# Patient Record
Sex: Male | Born: 1935 | Race: White | Hispanic: No | Marital: Single | Smoking: Light tobacco smoker
Health system: Southern US, Community
[De-identification: ages and names within clinical notes are randomized; demographics above are authoritative.]

## PROBLEM LIST (undated history)

## (undated) DIAGNOSIS — C772 Secondary and unspecified malignant neoplasm of intra-abdominal lymph nodes: Principal | ICD-10-CM

## (undated) DIAGNOSIS — G629 Polyneuropathy, unspecified: Secondary | ICD-10-CM

## (undated) DIAGNOSIS — C259 Malignant neoplasm of pancreas, unspecified: Secondary | ICD-10-CM

## (undated) DIAGNOSIS — K219 Gastro-esophageal reflux disease without esophagitis: Secondary | ICD-10-CM

## (undated) HISTORY — DX: Polyneuropathy, unspecified: G62.9

## (undated) HISTORY — DX: Gastro-esophageal reflux disease without esophagitis: K21.9

## (undated) HISTORY — PX: HERNIA REPAIR: SHX51

## (undated) HISTORY — DX: Secondary and unspecified malignant neoplasm of intra-abdominal lymph nodes: C77.2

## (undated) HISTORY — DX: Malignant neoplasm of pancreas, unspecified: C25.9

---

## 2003-06-30 ENCOUNTER — Encounter (INDEPENDENT_AMBULATORY_CARE_PROVIDER_SITE_OTHER): Payer: Self-pay | Admitting: *Deleted

## 2003-06-30 ENCOUNTER — Ambulatory Visit (HOSPITAL_COMMUNITY): Admission: RE | Admit: 2003-06-30 | Discharge: 2003-06-30 | Payer: Self-pay | Admitting: Gastroenterology

## 2007-04-14 ENCOUNTER — Encounter: Admission: RE | Admit: 2007-04-14 | Discharge: 2007-04-14 | Payer: Self-pay | Admitting: Family Medicine

## 2007-04-18 ENCOUNTER — Encounter: Admission: RE | Admit: 2007-04-18 | Discharge: 2007-04-18 | Payer: Self-pay | Admitting: Family Medicine

## 2007-04-25 ENCOUNTER — Encounter: Admission: RE | Admit: 2007-04-25 | Discharge: 2007-04-25 | Payer: Self-pay | Admitting: Family Medicine

## 2007-11-11 ENCOUNTER — Encounter: Admission: RE | Admit: 2007-11-11 | Discharge: 2007-11-11 | Payer: Self-pay | Admitting: Internal Medicine

## 2008-05-10 ENCOUNTER — Encounter: Admission: RE | Admit: 2008-05-10 | Discharge: 2008-05-10 | Payer: Self-pay | Admitting: Internal Medicine

## 2009-06-08 ENCOUNTER — Encounter: Admission: RE | Admit: 2009-06-08 | Discharge: 2009-06-08 | Payer: Self-pay | Admitting: Internal Medicine

## 2010-07-10 ENCOUNTER — Encounter: Admission: RE | Admit: 2010-07-10 | Discharge: 2010-07-10 | Payer: Self-pay | Admitting: Internal Medicine

## 2010-10-08 ENCOUNTER — Encounter: Payer: Self-pay | Admitting: Family Medicine

## 2011-02-02 NOTE — Op Note (Signed)
   NAME:  Frederick Fitzgerald, Frederick Fitzgerald NO.:  [id number]   MEDICAL RECORD NO.:  [id number]                   PATIENT TYPE:  AMB   LOCATION:  ENDO                                 FACILITY:  [facility]   PHYSICIAN:  Graylin Shiver, M.D.                DATE OF BIRTH:  03/12/1935   DATE OF PROCEDURE:  06/30/2003  DATE OF DISCHARGE:                                 OPERATIVE REPORT   PROCEDURE:  Colonoscopy.   INDICATIONS FOR PROCEDURE:  Heme positive stool.   CONSENT:  Informed consent was obtained after explanation of the risks of  bleeding, infection, and perforation.   PREMEDICATION:  This procedure was done immediately after an EGD with  additional 25 mcg of Fentanyl and 2 mg of  given.   DESCRIPTION OF PROCEDURE:  With the patient in the left lateral decubitus  position, the rectal exam was performed and no masses were felt.  The  Olympus colonoscope was inserted into the rectum and advanced around the  colon to the cecum.  Cecal landmarks were identified.  The cecum and  ascending colon were normal.  The transverse colon was normal.  The  descending colon and sigmoid showed a occasional diverticulum present.  The  rectum was normal.  He tolerated the procedure well without complications.   IMPRESSION:  Mild diverticulosis of the left colon.  Otherwise normal  colonoscopy to the cecum.                                                 Graylin Shiver, M.D.    [name]  D:  06/30/2003  T:  06/30/2003  Job:  [id number]

## 2011-02-02 NOTE — Op Note (Signed)
   NAME:  Frederick Fitzgerald, Frederick Fitzgerald NO.:  [id number]   MEDICAL RECORD NO.:  [id number]                   PATIENT TYPE:  AMB   LOCATION:  ENDO                                 FACILITY:  [facility]   PHYSICIAN:  Graylin Shiver, M.D.                DATE OF BIRTH:  1935/06/18   DATE OF PROCEDURE:  06/30/2003  DATE OF DISCHARGE:                                 OPERATIVE REPORT   PROCEDURE:  Esophagogastroduodenoscopy with biopsies.   ENDOSCOPIST:  Graylin Shiver, M.D.   INDICATIONS:  Heartburn, heme-positive stool.   INFORMED CONSENT:  Informed consent was obtained after explanation of the  risks of bleeding, infection and perforation.   PREMEDICATIONS:  Fentanyl 75 mcg IV, Versed 8 mg IV.   DESCRIPTION OF PROCEDURE:  With the patient in the left lateral decubitus  position, the Olympus gastroscope was inserted into the oropharynx and  passed into the esophagus.  It was advanced down the esophagus and into the  stomach and into the duodenum.  The second portion and bulb of the duodenum  looked normal.  The stomach showed a diffuse erythematous-appearance to the  mucosa compatible with mild gastritis.  No ulcers or erosions were seen.  The distal stomach was biopsied for CLOtest to look for evidence of  Helicobacter pylori.  No lesions were seen in the fundus or cardia of the  stomach upon retroflexion.  There was a small hiatal hernia.  The scope was  then straightened and brought into the esophagus.  The esophagogastric  junction appeared to be at about 37-38 cm from the incisor teeth region.  There was some irritation of the mucosa in this region, and biopsies were  obtained.  I suspect this represents an esophagitis in this area.  The rest  of the esophagus looked normal.  He tolerated the procedure well without  complications.   IMPRESSION:  1. Mild gastritis.  Biopsies were obtained for CLOtest to look for evidence     of Helicobacter pylori.  2. Small hiatal  hernia.  3. Suspect esophagitis at the gastroesophageal junction region.    PLAN:  1. The biopsies will be checked.  2. I will give this patient a prescription for Protonix 40 mg daily to try     to help him with his heartburn.                                               Graylin Shiver, M.D.    [name]  D:  06/30/2003  T:  06/30/2003  Job:  [id number]   cc:   L. Lupe Carney, M.D.  [address] [place]  [place] [address]  Fax: [phone]

## 2011-08-23 ENCOUNTER — Other Ambulatory Visit: Payer: Self-pay | Admitting: Internal Medicine

## 2011-08-23 DIAGNOSIS — R52 Pain, unspecified: Secondary | ICD-10-CM

## 2011-10-01 ENCOUNTER — Ambulatory Visit
Admission: RE | Admit: 2011-10-01 | Discharge: 2011-10-01 | Disposition: A | Discharge status: Home / Self Care | Payer: PRIVATE HEALTH INSURANCE | Source: Ambulatory Visit | Attending: Internal Medicine | Admitting: Internal Medicine

## 2011-10-01 DIAGNOSIS — R52 Pain, unspecified: Secondary | ICD-10-CM

## 2011-10-01 MED ORDER — GADOBENATE DIMEGLUMINE 529 MG/ML IV SOLN
13.0000 mL | Freq: Once | INTRAVENOUS | Status: AC | PRN
  Administered 2011-10-01: 13 mL via INTRAVENOUS

## 2013-02-15 ENCOUNTER — Ambulatory Visit (INDEPENDENT_AMBULATORY_CARE_PROVIDER_SITE_OTHER): Payer: Medicare Other | Admitting: Family Medicine

## 2013-02-15 VITALS — BP 145/72 | HR 73 | Temp 97.6°F | Resp 16 | Ht 72.0 in | Wt 156.2 lb

## 2013-02-15 DIAGNOSIS — L299 Pruritus, unspecified: Secondary | ICD-10-CM

## 2013-02-15 DIAGNOSIS — L259 Unspecified contact dermatitis, unspecified cause: Secondary | ICD-10-CM

## 2013-02-15 DIAGNOSIS — L309 Dermatitis, unspecified: Secondary | ICD-10-CM

## 2013-02-15 MED ORDER — TRIAMCINOLONE ACETONIDE 0.1 % EX OINT: TOPICAL_OINTMENT | Freq: Two times a day (BID) | CUTANEOUS | Status: DC

## 2013-02-15 NOTE — Patient Instructions (Signed)
Use OTC loratadine (Claritin) 10 mg one time each day, and Aveeno lotion to the area 2-3 times daily. Return for re-evaluation in 48 hours, sooner if the symptoms worsen, or if you develop pain at the site, more swelling, or fever.

## 2013-02-15 NOTE — Progress Notes (Signed)
I have examined this patient along with the student and agree.  

## 2013-02-15 NOTE — Progress Notes (Signed)
  Subjective:    Patient ID: Frederick Fitzgerald, male    DOB: 1935/06/17, 78 y.o.   MRN: [id number]  HPI  Patient noticed area of redness on LEFT lateral hand proximal to MCP joint yesterday afternoon (02/14/2013). States he was working in attic yesterday morning and then noticed the redness in the late afternoon. Area is approximately 6mm in diameter, circumferential, erythematous, edematous, and described as "mildly itchy". Patient denies pain, pain with ROM, or tenderness to touch. Denies using any medications for itching or new products on skins. Patient suspects possible spider bite but did not see any spiders. Patient is right hand dominant. Denies fever/chills, headache, dizziness, SOB, chest pain, abdominal pain.   Review of Systems As above.    Objective:   Physical Exam  General: WDWN male, appears stated age, no apparent distress HEENT: normocephalic, atraumatic, sclera/conjunctiva clear, neck supple, no JVD Resp: no accessory muscle use, clear to auscultation bilaterally, no rales/rhonchi/wheezes Cardiac: RRR, no murmurs/rubs/gallops Extremities: moves all limbs spontaneously, full ROM in hands bilaterally, 5/5 strength hands bilaterally  Neuro: alert & oriented x3, cranial nerves II-XII grossly intact Skin: approximately 6mm diameter firm circumferential lesion located on lateral left hand proximal to MCP joint is erythematous and edematous with blanching surrounding the lesion, non tender to palpation  No results found for this or any previous visit.     Assessment & Plan:   Hand dermatitis - Plan: triamcinolone ointment (KENALOG) 0.1 %  Itching  OTC Claritin for itching  Aveeno lotion as needed for dry skin   Return if lesions increases in size, swells further, or becomes painful. Return if fever develops.

## 2013-02-17 NOTE — Progress Notes (Signed)
patient discussed and examined with Ms. Leotis Shames and student. Agree with assessment and plan of care per note below. Suspected spider bite - no central necrosis. rtc precautions discussed.

## 2013-04-22 ENCOUNTER — Other Ambulatory Visit: Payer: Self-pay

## 2013-04-23 ENCOUNTER — Other Ambulatory Visit: Payer: Self-pay | Admitting: Otolaryngology

## 2013-04-27 ENCOUNTER — Telehealth (INDEPENDENT_AMBULATORY_CARE_PROVIDER_SITE_OTHER): Payer: Self-pay

## 2013-04-27 NOTE — Telephone Encounter (Signed)
Erroneous encounter

## 2013-07-23 ENCOUNTER — Other Ambulatory Visit: Payer: Self-pay

## 2013-08-25 ENCOUNTER — Other Ambulatory Visit: Payer: Self-pay | Admitting: Gastroenterology

## 2014-12-15 ENCOUNTER — Ambulatory Visit (INDEPENDENT_AMBULATORY_CARE_PROVIDER_SITE_OTHER): Payer: Medicare HMO | Admitting: Family Medicine

## 2014-12-15 ENCOUNTER — Encounter: Payer: Self-pay | Admitting: Family Medicine

## 2014-12-15 VITALS — BP 120/72 | HR 68 | Temp 97.7°F | Resp 16 | Ht 71.5 in | Wt 152.8 lb

## 2014-12-15 DIAGNOSIS — Z1211 Encounter for screening for malignant neoplasm of colon: Secondary | ICD-10-CM

## 2014-12-15 DIAGNOSIS — D171 Benign lipomatous neoplasm of skin and subcutaneous tissue of trunk: Secondary | ICD-10-CM

## 2014-12-15 DIAGNOSIS — R010 Benign and innocent cardiac murmurs: Secondary | ICD-10-CM | POA: Diagnosis not present

## 2014-12-15 DIAGNOSIS — Z0000 Encounter for general adult medical examination without abnormal findings: Secondary | ICD-10-CM | POA: Diagnosis not present

## 2014-12-15 DIAGNOSIS — Z125 Encounter for screening for malignant neoplasm of prostate: Secondary | ICD-10-CM

## 2014-12-15 DIAGNOSIS — G629 Polyneuropathy, unspecified: Secondary | ICD-10-CM

## 2014-12-15 DIAGNOSIS — E7800 Pure hypercholesterolemia, unspecified: Secondary | ICD-10-CM

## 2014-12-15 DIAGNOSIS — R011 Cardiac murmur, unspecified: Secondary | ICD-10-CM

## 2014-12-15 LAB — POCT URINALYSIS DIPSTICK
BILIRUBIN UA: NEGATIVE
Blood, UA: NEGATIVE
Glucose, UA: NEGATIVE
KETONES UA: NEGATIVE
Leukocytes, UA: NEGATIVE
Nitrite, UA: NEGATIVE
PH UA: 6.5
Protein, UA: 0.2
Spec Grav, UA: 1.015
Urobilinogen, UA: 0.2

## 2014-12-15 LAB — BASIC METABOLIC PANEL
BUN: 12 mg/dL (ref 6–23)
CO2: 26 mEq/L (ref 19–32)
CREATININE: 0.75 mg/dL (ref 0.50–1.35)
Calcium: 9.3 mg/dL (ref 8.4–10.5)
Chloride: 102 mEq/L (ref 96–112)
Glucose, Bld: 84 mg/dL (ref 70–99)
Potassium: 4.1 mEq/L (ref 3.5–5.3)
Sodium: 139 mEq/L (ref 135–145)

## 2014-12-15 LAB — LIPID PANEL
CHOLESTEROL: 169 mg/dL (ref 0–200)
HDL: 58 mg/dL (ref >=40)
LDL CALC: 95 mg/dL (ref 0–99)
TRIGLYCERIDES: 82 mg/dL (ref <150)
Total CHOL/HDL Ratio: 2.9 Ratio
VLDL: 16 mg/dL (ref 0–40)

## 2014-12-15 LAB — VITAMIN B12: Vitamin B-12: 243 pg/mL (ref 211–911)

## 2014-12-15 NOTE — Progress Notes (Signed)
Subjective:    Patient ID: Frederick Fitzgerald, male    DOB: [birth year], 80 y.o.   MRN: [id number]  HPI  This 80 y.o. Male is here for [facility] Subsequent annual CPE. Last CPE w/ another PCP in 2014; pt has copy of labs results from Nov 2014. He does not receive vaccines. He had CRS in 2014 (normal). His has large lump on upper back; considering removal.   There are no active problems to display for this patient.    Prior to Admission medications   Medication Sig Start Date End Date Taking? Authorizing Provider  triamcinolone ointment (KENALOG) 0.1 % Apply topically 2 (two) times daily. Patient not taking: Reported on 12/15/2014 02/15/13   Fara Chute, PA-C    Past Surgical History  Procedure Laterality Date  . Hernia repair      History   Social History  . Marital Status: Single    Spouse Name: N/A  . Number of Children: N/A  . Years of Education: N/A   Occupational History  . Not on file.   Social History Main Topics  . Smoking status: Never Smoker   . Smokeless tobacco: Not on file  . Alcohol Use: No  . Drug Use: No  . Sexual Activity: Not on file   Other Topics Concern  . Not on file   Social History Narrative    Family History  Problem Relation Age of Onset  . Heart disease Mother     Review of Systems  Constitutional: Negative.   HENT: Negative.   Eyes: Negative.   Respiratory: Negative.   Cardiovascular: Negative.   Gastrointestinal: Negative.   Endocrine: Negative.   Genitourinary: Negative.   Musculoskeletal: Positive for back pain.  Skin: Negative.   Allergic/Immunologic: Negative.   Neurological: Positive for numbness.       Leg and feet  Hematological: Negative.   Psychiatric/Behavioral: Negative.        Objective:   Physical Exam  Constitutional: He is oriented to person, place, and time. Vital signs are normal. He appears well-developed and well-nourished. No distress.  Blood pressure 120/72, pulse 68, temperature 97.7 F (36.5 C),  temperature source Oral, resp. rate 16, height 5' 11.5" (1.816 m), weight 152 lb 12.8 oz (69.31 kg), SpO2 100 %.   HENT:  Head: Normocephalic and atraumatic.  Right Ear: Hearing, tympanic membrane, external ear and ear canal normal.  Left Ear: Hearing, tympanic membrane, external ear and ear canal normal.  Nose: Nose normal. No nasal deformity or septal deviation.  Mouth/Throat: Uvula is midline and mucous membranes are normal. No oral lesions. Dental caries present. No uvula swelling. Posterior oropharyngeal erythema present. No oropharyngeal exudate.  Eyes: Conjunctivae and EOM are normal. Pupils are equal, round, and reactive to light. No scleral icterus.  L lid ptosis.  Neck: Trachea normal, normal range of motion and phonation normal. Neck supple. No JVD present. No spinous process tenderness and no muscular tenderness present. Carotid bruit is not present. No thyroid mass and no thyromegaly present.  Cardiovascular: Normal rate, regular rhythm, S1 normal, S2 normal, intact distal pulses and normal pulses.   No extrasystoles are present. PMI is not displaced.  Exam reveals no gallop.   Murmur heard.  Systolic murmur is present with a grade of 3/6  SEM heard best at LLSB > apex.  Pulmonary/Chest: Effort normal and breath sounds normal. No respiratory distress. He has no decreased breath sounds. He has no wheezes. He has no rhonchi.  Abdominal: Soft. Normal  appearance and bowel sounds are normal. He exhibits no distension, no abdominal bruit, no pulsatile midline mass and no mass. There is no hepatosplenomegaly. There is no tenderness. There is no guarding and no CVA tenderness.  Scaphoid abdomen.  Genitourinary: Rectum normal and prostate normal. Rectal exam shows no external hemorrhoid, no fissure, no mass, no tenderness and anal tone normal. Guaiac negative stool. Prostate is not enlarged and not tender.  Musculoskeletal:       Cervical back: He exhibits decreased range of motion and  deformity. He exhibits no tenderness, no swelling and no pain.       Thoracic back: He exhibits deformity. He exhibits no tenderness.       Lumbar back: Normal.  Remainder of exam remarkable for mild degenerative changes. Thoracic spine- kyphosis.  Lymphadenopathy:    He has no cervical adenopathy.  Neurological: He is alert and oriented to person, place, and time. He has normal strength. He displays no atrophy. A cranial nerve deficit is present. No sensory deficit. He exhibits normal muscle tone. He displays a negative Romberg sign. Coordination and gait normal.  Reflex Scores:      Tricep reflexes are 2+ on the right side and 2+ on the left side.      Bicep reflexes are 2+ on the right side and 2+ on the left side.      Brachioradialis reflexes are 2+ on the right side and 2+ on the left side.      Patellar reflexes are 2+ on the right side and 2+ on the left side. L eyelid ptosis. GET UP and GO test: time= 8 sec.  Skin: Skin is warm, dry and intact. Lesion noted. No ecchymosis and no rash noted. He is not diaphoretic. No cyanosis or erythema. Nails show no clubbing.  Large lipoma on R upper back overlying scapula. Also has sebaceous cyst that has broken through the skin; hardened sebaceous material visible.  Psychiatric: He has a normal mood and affect. His speech is normal and behavior is normal. Thought content normal. Cognition and memory are normal.       Assessment & Plan:  Medicare annual wellness visit, subsequent - Plan: POCT urinalysis dipstick  Elevated LDL cholesterol level - Plan: Lipid panel  Screening for prostate cancer - Plan: PSA, Medicare  Peripheral neuropathy - Plan: Basic metabolic panel, Vitamin N56  Undiagnosed cardiac murmurs - Plan: 2D Echocardiogram without contrast  Lipoma of back -  Has another skin lesion in close proximity ot lipoma that could be removed at same time. Plan: Ambulatory referral to General Surgery  Screening for colon cancer - Plan:  IFOBT POC (occult bld, rslt in office)

## 2014-12-15 NOTE — Patient Instructions (Addendum)
Keeping you healthy  Get these tests  Blood pressure- Have your blood pressure checked once a year by your healthcare provider.  Normal blood pressure is 120/80  Weight- Have your body mass index (BMI) calculated to screen for obesity.  BMI is a measure of body fat based on height and weight. You can also calculate your own BMI at ViewBanking.si.  Cholesterol- Have your cholesterol checked every year.  Diabetes- Have your blood sugar checked regularly if you have high blood pressure, high cholesterol, have a family history of diabetes or if you are overweight.  Screening for Colon Cancer- Colonoscopy starting at age 94.  Screening may begin sooner depending on your family history and other health conditions. Follow up colonoscopy as directed by your Gastroenterologist.  Screening for Prostate Cancer- Both blood work (PSA) and a rectal exam help screen for Prostate Cancer.  Screening begins at age 54 with African-American men and at age 93 with Caucasian men.  Screening may begin sooner depending on your family history.  Take these medicines  Aspirin- One aspirin daily can help prevent Heart disease and Stroke.  Flu shot- Every fall.  Tetanus- Every 10 years. You have declined all vaccines.  Zostavax- Once after the age of 8 to prevent Shingles. You have declined all vaccines.  Pneumonia shot- Once after the age of 86; if you are younger than 59, ask your healthcare provider if you need a Pneumonia shot. You have declined all vaccines.  Take these steps  Don't smoke- If you do smoke, talk to your doctor about quitting.  For tips on how to quit, go to www.smokefree.gov or call 1-800-QUIT-NOW.  Be physically active- Exercise 5 days a week for at least 30 minutes.  If you are not already physically active start slow and gradually work up to 30 minutes of moderate physical activity.  Examples of moderate activity include walking briskly, mowing the yard, dancing, swimming,  bicycling, etc.  Eat a healthy diet- Eat a variety of healthy food such as fruits, vegetables, low fat milk, low fat cheese, yogurt, lean meant, poultry, fish, beans, tofu, etc. For more information go to www.thenutritionsource.org  Drink alcohol in moderation- Limit alcohol intake to less than two drinks a day. Never drink and drive.  Dentist- Brush and floss twice daily; visit your dentist twice a year.  Depression- Your emotional health is as important as your physical health. If you're feeling down, or losing interest in things you would normally enjoy please talk to your healthcare provider.  Eye exam- Visit your eye doctor every year.  Safe sex- If you may be exposed to a sexually transmitted infection, use a condom.  Seat belts- Seat belts can save your life; always wear one.  Smoke/Carbon Monoxide detectors- These detectors need to be installed on the appropriate level of your home.  Replace batteries at least once a year.  Skin cancer- When out in the sun, cover up and use sunscreen 15 SPF or higher.  Violence- If anyone is threatening you, please tell your healthcare provider.  Living Will/ Health care power of attorney- Speak with your healthcare provider and family.   Our clinic will contact you with appointments for the General Surgeon and Echocardiogram.  I will be in touch with you about the results of the ECHO once I have reviewed them. We can then discuss if further evaluation by a specialist is warranted. If you need to see a physician after June 1. 2016, I suggest you get established with Dr.  Carlota Raspberry or Dr. Tamala Julian.

## 2014-12-16 ENCOUNTER — Encounter: Payer: Medicare Other | Admitting: Family Medicine

## 2014-12-16 LAB — PSA, MEDICARE: PSA: 2.82 ng/mL (ref <=4.00)

## 2014-12-27 LAB — FECAL OCCULT BLOOD, GUAIAC: Fecal Occult Blood: NEGATIVE

## 2014-12-27 LAB — IFOBT (OCCULT BLOOD): IMMUNOLOGICAL FECAL OCCULT BLOOD TEST: NEGATIVE

## 2014-12-31 ENCOUNTER — Telehealth: Payer: Self-pay

## 2014-12-31 NOTE — Telephone Encounter (Signed)
[facility] called to schedule appt with patient. He states he knew nothing about a referral. Please advise

## 2015-01-01 NOTE — Telephone Encounter (Signed)
Spoke with pt. He was more worried about the Echo being covered under Medicare. Can we look into this for him. Thanks

## 2015-01-01 NOTE — Telephone Encounter (Signed)
Please contact [facility] to make sure that his echo can be covered by insurance.

## 2015-01-17 ENCOUNTER — Ambulatory Visit: Payer: Self-pay | Admitting: Surgery

## 2015-01-17 NOTE — H&P (Signed)
History of Present Illness Frederick Fitzgerald. Tessie Ordaz MD; 01/17/2015 10:51 AM) Patient words: lipoma right upper back.  The patient is a 80 year old male who presents with a complaint of Mass. Referred by Dr. Ellsworth Lennox - for evaluation of large mass on posterior right shoulder and chronic sebaceous cyst -posterior right shoulder. This is a 80 year old male in good health who presents with at least 20 years of an enlarging mass on his posterior right shoulder. This has become fairly large and causes some mild discomfort. It has never become infected or inflamed. Just medial and superior to this mass the patient has a small cyst that was previously drained by a primary care physician. It has recurred and has had some chronic drainage. This causes some mild discomfort. Other Problems (Ammie Eversole, LPN; 5/0/9326 71:24 AM) Back Pain Gastroesophageal Reflux Disease Heart murmur Inguinal Hernia Kidney Stone Other disease, cancer, significant illness  Past Surgical History (Ammie Eversole, LPN; 01/22/997 33:82 AM) Open Inguinal Hernia Surgery Bilateral. Oral Surgery  Diagnostic Studies History (Ammie Eversole, LPN; 5/0/5397 67:34 AM) Colonoscopy 1-5 years ago  Allergies (Ammie Eversole, LPN; 09/25/3788 24:09 AM) No Known Drug Allergies 01/17/2015  Medication History (Ammie Eversole, LPN; 03/19/5328 92:42 AM) Vitamin B 12 (100MCG Lozenge, Oral) Active.  Social History (Ammie Eversole, LPN; 02/22/3418 62:22 AM) Alcohol use Moderate alcohol use. Caffeine use Coffee, Tea. Tobacco use Former smoker.  Family History (Ammie Eversole, LPN; 05/25/9891 11:94 AM) Heart Disease Brother, Mother.     Review of Systems (Ammie Eversole LPN; 09/24/4079 44:81 AM) General Not Present- Appetite Loss, Chills, Fatigue, Fever, Night Sweats, Weight Gain and Weight Loss. Skin Present- Rash. Not Present- Change in Wart/Mole, Dryness, Hives, Jaundice, New Lesions, Non-Healing Wounds and Ulcer. HEENT  Present- Ringing in the Ears and Wears glasses/contact lenses. Not Present- Earache, Hearing Loss, Hoarseness, Nose Bleed, Oral Ulcers, Seasonal Allergies, Sinus Pain, Sore Throat, Visual Disturbances and Yellow Eyes. Respiratory Not Present- Bloody sputum, Chronic Cough, Difficulty Breathing, Snoring and Wheezing. Breast Not Present- Breast Mass, Breast Pain, Nipple Discharge and Skin Changes. Cardiovascular Not Present- Chest Pain, Difficulty Breathing Lying Down, Leg Cramps, Palpitations, Rapid Heart Rate, Shortness of Breath and Swelling of Extremities. Gastrointestinal Present- Bloating and Excessive gas. Not Present- Abdominal Pain, Bloody Stool, Change in Bowel Habits, Chronic diarrhea, Constipation, Difficulty Swallowing, Gets full quickly at meals, Hemorrhoids, Indigestion, Nausea, Rectal Pain and Vomiting. Male Genitourinary Not Present- Blood in Urine, Change in Urinary Stream, Frequency, Impotence, Nocturia, Painful Urination, Urgency and Urine Leakage. Musculoskeletal Present- Back Pain. Not Present- Joint Pain, Joint Stiffness, Muscle Pain, Muscle Weakness and Swelling of Extremities. Neurological Present- Numbness and Tingling. Not Present- Decreased Memory, Fainting, Headaches, Seizures, Tremor, Trouble walking and Weakness. Psychiatric Not Present- Anxiety, Bipolar, Change in Sleep Pattern, Depression, Fearful and Frequent crying. Endocrine Not Present- Cold Intolerance, Excessive Hunger, Hair Changes, Heat Intolerance, Hot flashes and New Diabetes. Hematology Not Present- Easy Bruising, Excessive bleeding, Gland problems, HIV and Persistent Infections.  Vitals (Ammie Eversole LPN; 04/21/6313 97:02 AM) 01/17/2015 10:12 AM Weight: 153.8 lb Height: 72in Body Surface Area: 1.88 m Body Mass Index: 20.86 kg/m Pulse: 80 (Regular)  BP: 128/62 (Sitting, Left Arm, Standard)     Physical Exam Rodman Key K. Halley Kincer MD; 01/17/2015 10:53 AM)  The physical exam findings are as  follows: Note:WDWN in NAD HEENT: EOMI, sclera anicteric Neck: No masses, no thyromegaly Lungs: CTA bilaterally; normal respiratory effort CV: Regular rate and rhythm; no murmurs Back: large well-demarcated subcutaneous soft tissue mass overlying right scapula; no sign  of infection or inflammation Medial and superior to the lipoma, there is a 2-3 cm firm subcutaneous mass with a 1 cm opening, draining some hard dried sebum; no active infection Abd: +bowel sounds, soft, non-tender, no masses Ext: Well-perfused; no edema Skin: Warm, dry; no sign of jaundice    Assessment & Plan Rodman Key K. 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 The surgical procedure has been discussed with the patient. Potential risks, benefits, alternative treatments, and expected outcomes have been explained. All of the patient's questions at this time have been answered. The likelihood of reaching the patient's treatment goal is good. The patient understand the proposed surgical procedure and wishes to proceed.   Frederick Fitzgerald. Georgette Dover, MD, [facility]  General/ Trauma Surgery  01/17/2015 10:53 AM

## 2015-01-25 ENCOUNTER — Other Ambulatory Visit (HOSPITAL_COMMUNITY): Payer: Self-pay | Admitting: Surgery

## 2015-03-14 ENCOUNTER — Other Ambulatory Visit: Payer: Self-pay

## 2015-12-06 ENCOUNTER — Encounter: Payer: Self-pay | Admitting: Family Medicine

## 2015-12-14 ENCOUNTER — Other Ambulatory Visit (INDEPENDENT_AMBULATORY_CARE_PROVIDER_SITE_OTHER): Payer: Medicare HMO

## 2015-12-14 ENCOUNTER — Telehealth: Payer: Self-pay | Admitting: Family Medicine

## 2015-12-14 DIAGNOSIS — Z131 Encounter for screening for diabetes mellitus: Secondary | ICD-10-CM

## 2015-12-14 DIAGNOSIS — Z125 Encounter for screening for malignant neoplasm of prostate: Secondary | ICD-10-CM

## 2015-12-14 DIAGNOSIS — Z13220 Encounter for screening for lipoid disorders: Secondary | ICD-10-CM

## 2015-12-14 DIAGNOSIS — E7800 Pure hypercholesterolemia, unspecified: Secondary | ICD-10-CM

## 2015-12-14 NOTE — Telephone Encounter (Signed)
Requests bloodwork for upcoming physical. On 12/14/15.  Labs ordered.

## 2015-12-15 DIAGNOSIS — Z125 Encounter for screening for malignant neoplasm of prostate: Secondary | ICD-10-CM | POA: Diagnosis not present

## 2015-12-15 DIAGNOSIS — E7800 Pure hypercholesterolemia, unspecified: Secondary | ICD-10-CM | POA: Diagnosis not present

## 2015-12-15 LAB — COMPREHENSIVE METABOLIC PANEL
ALK PHOS: 53 U/L (ref 40–115)
ALT: 21 U/L (ref 9–46)
AST: 25 U/L (ref 10–35)
Albumin: 4.1 g/dL (ref 3.6–5.1)
BILIRUBIN TOTAL: 0.7 mg/dL (ref 0.2–1.2)
BUN: 9 mg/dL (ref 7–25)
CALCIUM: 9.6 mg/dL (ref 8.6–10.3)
CO2: 28 mmol/L (ref 20–31)
Chloride: 104 mmol/L (ref 98–110)
Creat: 0.75 mg/dL (ref 0.70–1.18)
GLUCOSE: 91 mg/dL (ref 65–99)
Potassium: 5.1 mmol/L (ref 3.5–5.3)
Sodium: 139 mmol/L (ref 135–146)
TOTAL PROTEIN: 7 g/dL (ref 6.1–8.1)

## 2015-12-15 LAB — LIPID PANEL
CHOLESTEROL: 168 mg/dL (ref 125–200)
HDL: 51 mg/dL (ref >=40)
LDL Cholesterol: 100 mg/dL (ref <130)
Total CHOL/HDL Ratio: 3.3 Ratio (ref <=5.0)
Triglycerides: 83 mg/dL (ref <150)
VLDL: 17 mg/dL (ref <30)

## 2015-12-16 LAB — PSA, MEDICARE: PSA: 3.19 ng/mL (ref <=4.00)

## 2015-12-19 ENCOUNTER — Encounter: Payer: Medicare HMO | Admitting: Family Medicine

## 2015-12-20 ENCOUNTER — Encounter: Payer: Self-pay | Admitting: Family Medicine

## 2015-12-21 ENCOUNTER — Ambulatory Visit (INDEPENDENT_AMBULATORY_CARE_PROVIDER_SITE_OTHER): Payer: Medicare HMO | Admitting: Family Medicine

## 2015-12-21 ENCOUNTER — Ambulatory Visit (INDEPENDENT_AMBULATORY_CARE_PROVIDER_SITE_OTHER): Payer: Medicare HMO

## 2015-12-21 ENCOUNTER — Encounter: Payer: Self-pay | Admitting: Family Medicine

## 2015-12-21 VITALS — BP 124/68 | HR 92 | Temp 97.8°F | Resp 16 | Ht 70.5 in | Wt 147.4 lb

## 2015-12-21 DIAGNOSIS — R140 Abdominal distension (gaseous): Secondary | ICD-10-CM | POA: Diagnosis not present

## 2015-12-21 DIAGNOSIS — M47816 Spondylosis without myelopathy or radiculopathy, lumbar region: Secondary | ICD-10-CM | POA: Diagnosis not present

## 2015-12-21 DIAGNOSIS — M545 Low back pain: Secondary | ICD-10-CM | POA: Diagnosis not present

## 2015-12-21 DIAGNOSIS — H578 Other specified disorders of eye and adnexa: Secondary | ICD-10-CM | POA: Diagnosis not present

## 2015-12-21 DIAGNOSIS — R29890 Loss of height: Secondary | ICD-10-CM

## 2015-12-21 DIAGNOSIS — R1011 Right upper quadrant pain: Secondary | ICD-10-CM

## 2015-12-21 DIAGNOSIS — H5789 Other specified disorders of eye and adnexa: Secondary | ICD-10-CM

## 2015-12-21 DIAGNOSIS — M542 Cervicalgia: Secondary | ICD-10-CM

## 2015-12-21 DIAGNOSIS — L568 Other specified acute skin changes due to ultraviolet radiation: Secondary | ICD-10-CM

## 2015-12-21 DIAGNOSIS — M546 Pain in thoracic spine: Secondary | ICD-10-CM | POA: Diagnosis not present

## 2015-12-21 DIAGNOSIS — Z0000 Encounter for general adult medical examination without abnormal findings: Secondary | ICD-10-CM | POA: Diagnosis not present

## 2015-12-21 DIAGNOSIS — G6289 Other specified polyneuropathies: Secondary | ICD-10-CM

## 2015-12-21 NOTE — Progress Notes (Signed)
Subjective:    Patient ID: Frederick Fitzgerald, male    DOB: 1935/08/31, 81 y.o.   MRN: [id number] By signing my name below, I, Zola Button, attest that this documentation has been prepared under the direction and in the presence of [name], MD.  Electronically Signed: Zola Button, Medical Scribe. 12/21/2015. 1:48 PM.  HPI  HPI Comments: Frederick Fitzgerald is a 81 y.o. male who presents to the [facility] for an annual exam. Last annual Wellness Exam in Mar 2016 with Dr. Leward Quan. Heart murmur was noted at that time. Echocardiogram was ordered, but it does not appear this has been done. There was some concerns about the echo being covered by his insurance.   Cancer screening:  Colon cancer screening - Per last visit, he had a colorectal screening in 2014 which was normal. Prostate cancer screening - PSA drawn last week. 3.19. Slightly increased from 2.82 in Mar 2016. Lipid panel and CMP were normal. Patient denies difficulty urinating and hematuria.  Immunizations: He refused vaccines last year. He does not usually get vaccinations. Immunization History  Administered Date(s) Administered  . Td 09/17/2005    Depression screening:  Depression screen [facility] 2/9 12/21/2015 12/15/2014 12/15/2014  Decreased Interest 0 0 0  Down, Depressed, Hopeless 0 0 -  PHQ - 2 Score 0 0 0   Fall screening: No falls in the past year.  Functional status screening: Blind in left eye, otherwise no positive responses.  Optho: His last visit with an eye doctor was about 3 years ago. 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  Visual Acuity Screening   Right eye Left eye Both eyes  Without correction:     With correction: 20/40    Comments: Pt stated that &quot;He is legally blind in left eye.   Physical activity: He does not exercise regularly.  Dentist: He last saw his dentist,  about a year ago.  Advanced directives: He does not have a living will.  Abdominal bloating:  Patient has had some abdominal bloating/gas after eating. This resolves on its own without passing gas. He has tried peppermint tea for this. He has had intermittent RUQ abdominal discomfort as a result over the past 2 months. He has the discomfort every day. This is not associated with eating. Patient denies cough, SOB, fever, diarrhea, nausea, vomiting, hematuria, and heartburn.  Loss of height: Patient states he has had some noticeable loss of height over the past 4-5 years.  He feels he has been slouching more. Patient states he has had low back pain over the past 50 years or so. He had been evaluated for the back pain in the past; it had been suggested that he have surgery for his back, but he declined because he felt he could deal with the pain. He has a FMHx of osteoporosis in his mother and nephew.  Tinnitus: Patient has had chronic tinnitus. He denies hearing loss.  Eye itching: He has had itching in his left eye, which he thinks could be due to a dry socket. He has not tried any eyedrops. He has not seen an eye doctor recently.  Drooling: Patient has been drooling at night.  Neck pain: He has neck pain which he attributes to the way he sleeps. This is not a new problem.  Sun rashes: Patient states he will have erythematous, pruritic rashes shortly after sun exposure, but these resolve on their own within a few hours. He has had  these rashes over the past few years.  Numbness: He has had numbness and tingling in his feet and hands (mostly left wrist) for over 12 years, but this has been worsening every year. Patient had been told he had peripheral neuropathy due to his back. He had a normal vitamin B12 in Mar 2016.  Nevi: Patient states he has some nevi behind his left ear and on his left cheek which he would like examined and would like to be evaluated by dermatology. He has not seen a dermatologist recently.   There are no active problems to display for this patient.  Past Medical  History  Diagnosis Date  . GERD (gastroesophageal reflux disease)    Past Surgical History  Procedure Laterality Date  . Hernia repair     No Known Allergies Prior to Admission medications   Medication Sig Start Date End Date Taking? Authorizing Provider  triamcinolone ointment (KENALOG) 0.1 % Apply topically 2 (two) times daily. Patient not taking: Reported on 12/15/2014 02/15/13   Harrison Mons, PA-C   Social History   Social History  . Marital Status: Single    Spouse Name: N/A  . Number of Children: N/A  . Years of Education: N/A   Occupational History  . Not on file.   Social History Main Topics  . Smoking status: Never Smoker   . Smokeless tobacco: Not on file  . Alcohol Use: No  . Drug Use: No  . Sexual Activity: Not on file   Other Topics Concern  . Not on file   Social History Narrative     Review of Systems  HENT: Positive for drooling and tinnitus.   Eyes: Positive for itching.  Gastrointestinal: Positive for abdominal pain.  Musculoskeletal: Positive for back pain and neck pain.  Skin: Positive for rash.  Neurological: Positive for numbness.  All other systems reviewed and are negative.  13 point ROS reviewed on patient health survey. Negative other than listed above.     Objective:   Physical Exam  Constitutional: He is oriented to person, place, and time. He appears well-developed and well-nourished.  HENT:  Head: Normocephalic and atraumatic.  Right Ear: External ear normal.  Left Ear: External ear normal.  Mouth/Throat: Oropharynx is clear and moist.  Eyes: Conjunctivae and EOM are normal. Pupils are equal, round, and reactive to light.  Left eye ptosis. Able to approximate eyes without difficulty. Able to open partially on the left.  Neck: Normal range of motion. Neck supple. No thyromegaly present.  Cardiovascular: Normal rate, regular rhythm and intact distal pulses.   Murmur heard.  Systolic murmur is present with a grade of 2/6    Pulses:      Radial pulses are 2+ on the right side, and 2+ on the left side.       Dorsalis pedis pulses are 2+ on the right side, and 2+ on the left side.  Varicosities into the lower extremities. Capillary refill < 1 second. Approximately 2/6 systolic murmur, left sternal border. No murmur at the carotids.  Pulmonary/Chest: Effort normal and breath sounds normal. No respiratory distress. He has no wheezes.  Pectus carinatum on chest wall.  Abdominal: Soft. He exhibits no distension. There is no tenderness. Hernia confirmed negative in the right inguinal area and confirmed negative in the left inguinal area.  Non-tender on exam. Area of concern is located RUQ and just below the right rib margin.  Genitourinary: Prostate normal.  Musculoskeletal: Normal range of motion. He exhibits no edema  or tenderness.  Slight kyphosis mid to upper back.  Lymphadenopathy:    He has no cervical adenopathy.  Neurological: He is alert and oriented to person, place, and time. He has normal reflexes.  Slight decreased sensation, dorsum of left foot. Decreased sensation bilateral plantar feet. Sensation overall equal on both wrists.  Skin: Skin is warm and dry.  Multiple scattered nevi, left greater than right back. Large, thickened projection, left upper back.  Psychiatric: He has a normal mood and affect. His behavior is normal.  Vitals reviewed.   Filed Vitals:   12/21/15 1329  BP: 124/68  Pulse: 92  Temp: 97.8 F (36.6 C)  TempSrc: Oral  Resp: 16  Height: 5' 10.5" (1.791 m)  Weight: 147 lb 6.4 oz (66.86 kg)  SpO2: 99%    Dg Thoracic Spine 2 View  12/21/2015  CLINICAL DATA:  Back pain.  Loss of height. EXAM: THORACIC SPINE 2 VIEWS COMPARISON:  None FINDINGS: There is osteopenia with slight accentuation of the thoracic kyphosis. There is slight disc space narrowing in the mid thoracic spine. No fractures or bone destruction. Minimal mid thoracic scoliosis. IMPRESSION: No fractures or destructive  lesions. Slight scoliosis and accentuated thoracic kyphosis. Electronically Signed   By: Lorriane Shire M.D.   On: 12/21/2015 15:06   Dg Lumbar Spine 2-3 Views  12/21/2015  CLINICAL DATA:  Chronic back pain.  Height loss. EXAM: LUMBAR SPINE - 2-3 VIEW COMPARISON:  MRI 10/01/2011.  CT 07/10/2010. FINDINGS: Diffuse severe degenerative change with scoliosis concave right. No acute abnormality. No compression fracture noted. Soft tissues unremarkable. IMPRESSION: Prominent scoliosis concave right with diffuse severe degenerative change. No acute bony abnormality identified. Electronically Signed   By: Marcello Moores  Register   On: 12/21/2015 15:05        Assessment & Plan:  Frederick Fitzgerald is a 81 y.o. male Medicare annual wellness visit, subsequent  --anticipatory guidance as below in AVS, screening labs above. Health maintenance items as above in HPI discussed/recommended as applicable. Advised to let me know if he changes his mind on immunizations.  Midline low back pain, with sciatica presence unspecified - Plan: DG Lumbar Spine 2-3 Views, DG Thoracic Spine 2 View Loss of height - Plan: DG Lumbar Spine 2-3 Views, DG Thoracic Spine 2 View  Apparent compression fractures or acute findings on imaging. Suspect degenerative disc disease/arthritic change. Can discuss further at follow-up visit. History of osteoporosis, so may benefit from screening. This can be discussed further next visit including more details on his family history.  Bloating symptom RUQ abdominal pain - Plan: US Abdomen Limited RUQ  -Check right upper quadrant ultrasound. Recent liver testing normal. Advised to avoid foods that are associated with gas or bloating. RTC precautions if acute worsening.  Other polyneuropathy ([place]) - Plan: TSH, Ambulatory referral to Neurology  -Peripheral neuropathy. Refer to neurology for evaluation.  Neck pain  -Degenerative disc disease likely. Symptomatic care discussed, RTC precautions if  worsening.  Itch of left eye  -Allergies versus dry eye. For now can try blink or refresh artificial tears, if symptoms persist, return to discuss further or can follow-up with his ophthalmologist to evaluate this problem  Photodermatitis due to sun - Plan: Ambulatory referral to Dermatology  -Refer to dermatology for evaluation. Keep covered or avoid sun exposure for now.  No orders of the defined types were placed in this encounter.   Patient Instructions    IF you received an x-ray today, you will receive an invoice from [facility]  Radiology. Please contact [facility]iology at [phone] with questions or concerns regarding your invoice.   IF you received labwork today, you will receive an invoice from [employer]. Please contact [facility] at [phone] with questions or concerns regarding your invoice.   Our billing staff will not be able to assist you with questions regarding bills from these companies.  You will be contacted with the lab results as soon as they are available. The fastest way to get your results is to activate your My Chart account. Instructions are located on the last page of this paperwork. If you have not heard from [place] regarding the results in 2 weeks, please contact this office.    For dry eye - can try Systane, Blink, or artificial tears over the counter. If these do not help - return to discuss further, or follow up with eye care provider.   I will refer you to dermatology for skin check and to discuss rash with sun exposure.   PSA has increase some this past year - return for repeat test in 6 months, sooner if any new urinary symptoms.  Call your eye care provider for appointment.   i will check a thyroid test and refer you to neurologist for neuropathy in feet.   Your liver tests looked ok on labs last week, but with your upper abdominal pain - will check ultrasound.  Follow up in next few weeks to discuss this further if  persists and to finish discussing other items from today, including bloating, and ringing in ears.   Return to the clinic or go to the nearest emergency room if any of your symptoms worsen or new symptoms occur.  See handout on advanced directives and let me know if we can help you with this paperwork.   Keeping you healthy  Get these tests  Blood pressure- Have your blood pressure checked once a year by your healthcare provider.  Normal blood pressure is 120/80  Weight- Have your body mass index (BMI) calculated to screen for obesity.  BMI is a measure of body fat based on height and weight. You can also calculate your own BMI at ViewBanking.[email or web address].  Cholesterol- Have your cholesterol checked every year.  Diabetes- Have your blood sugar checked regularly if you have high blood pressure, high cholesterol, have a family history of diabetes or if you are overweight.  Screening for Colon Cancer- Colonoscopy starting at age 64.  Screening may begin sooner depending on your family history and other health conditions. Follow up colonoscopy as directed by your Gastroenterologist.  Screening for Prostate Cancer- Both blood work (PSA) and a rectal exam help screen for Prostate Cancer.  Screening begins at age 55 with African-American men and at age 68 with Caucasian men.  Screening may begin sooner depending on your family history.  Take these medicines  Aspirin- One aspirin daily can help prevent Heart disease and Stroke.  Flu shot- Every fall.  Tetanus- Every 10 years.  Zostavax- Once after the age of 38 to prevent Shingles.  Pneumonia shot- Once after the age of 67; if you are younger than 29, ask your healthcare provider if you need a Pneumonia shot.  Take these steps  Don't smoke- If you do smoke, talk to your doctor about quitting.  For tips on how to quit, go to www.smokefree.gov or call 1-800-QUIT-NOW.  Be physically active- Exercise 5 days a week for at least 30 minutes.   If you are not already physically active start  slow and gradually work up to 30 minutes of moderate physical activity.  Examples of moderate activity include walking briskly, mowing the yard, dancing, swimming, bicycling, etc.  Eat a healthy diet- Eat a variety of healthy food such as fruits, vegetables, low fat milk, low fat cheese, yogurt, lean meant, poultry, fish, beans, tofu, etc. For more information go to www.thenutritionsource.org  Drink alcohol in moderation- Limit alcohol intake to less than two drinks a day. Never drink and drive.  Dentist- Brush and floss twice daily; visit your dentist twice a year.  Depression- Your emotional health is as important as your physical health. If you're feeling down, or losing interest in things you would normally enjoy please talk to your healthcare provider.  Eye exam- Visit your eye doctor every year.  Safe sex- If you may be exposed to a sexually transmitted infection, use a condom.  Seat belts- Seat belts can save your life; always wear one.  Smoke/Carbon Monoxide detectors- These detectors need to be installed on the appropriate level of your home.  Replace batteries at least once a year.  Skin cancer- When out in the sun, cover up and use sunscreen 15 SPF or higher.  Violence- If anyone is threatening you, please tell your healthcare provider.  Living Will/ Health care power of attorney- Speak with your healthcare provider and family.  Abdominal Pain, Adult Many things can cause abdominal pain. Usually, abdominal pain is not caused by a disease and will improve without treatment. It can often be observed and treated at home. Your health care provider will do a physical exam and possibly order blood tests and X-rays to help determine the seriousness of your pain. However, in many cases, more time must pass before a clear cause of the pain can be found. Before that point, your health care provider may not know if you need more testing or  further treatment. HOME CARE INSTRUCTIONS Monitor your abdominal pain for any changes. The following actions may help to alleviate any discomfort you are experiencing:  Only take over-the-counter or prescription medicines as directed by your health care provider.  Do not take laxatives unless directed to do so by your health care provider.  Try a clear liquid diet (broth, tea, or water) as directed by your health care provider. Slowly move to a bland diet as tolerated. SEEK MEDICAL CARE IF:  You have unexplained abdominal pain.  You have abdominal pain associated with nausea or diarrhea.  You have pain when you urinate or have a bowel movement.  You experience abdominal pain that wakes you in the night.  You have abdominal pain that is worsened or improved by eating food.  You have abdominal pain that is worsened with eating fatty foods.  You have a fever. SEEK IMMEDIATE MEDICAL CARE IF:  Your pain does not go away within 2 hours.  You keep throwing up (vomiting).  Your pain is felt only in portions of the abdomen, such as the right side or the left lower portion of the abdomen.  You pass bloody or black tarry stools. MAKE SURE YOU:  Understand these instructions.  Will watch your condition.  Will get help right away if you are not doing well or get worse.   This information is not intended to replace advice given to you by your health care provider. Make sure you discuss any questions you have with your health care provider.   Document Released: 06/13/2005 Document Revised: 05/25/2015 Document Reviewed: 05/13/2013 Elsevier Interactive Patient Education  2016 Elsevier Inc.   Peripheral Neuropathy Peripheral neuropathy is a type of nerve damage. It affects nerves that carry signals between the spinal cord and other parts of the body. These are called peripheral nerves. With peripheral neuropathy, one nerve or a group of nerves may be damaged.  CAUSES  Many things can  damage peripheral nerves. For some people with peripheral neuropathy, the cause is unknown. Some causes include:  Diabetes. This is the most common cause of peripheral neuropathy.  Injury to a nerve.  Pressure or stress on a nerve that lasts a long time.  Too little vitamin B. Alcoholism can lead to this.  Infections.  Autoimmune diseases, such as multiple sclerosis and systemic lupus erythematosus.  Inherited nerve diseases.  Some medicines, such as cancer drugs.  Toxic substances, such as lead and mercury.  Too little blood flowing to the legs.  Kidney disease.  Thyroid disease. SIGNS AND SYMPTOMS  Different people have different symptoms. The symptoms you have will depend on which of your nerves is damaged. Common symptoms include:  Loss of feeling (numbness) in the feet and hands.  Tingling in the feet and hands.  Pain that burns.  Very sensitive skin.  Weakness.  Not being able to move a part of the body (paralysis).  Muscle twitching.  Clumsiness or poor coordination.  Loss of balance.  Not being able to control your bladder.  Feeling dizzy.  Sexual problems. DIAGNOSIS  Peripheral neuropathy is a symptom, not a disease. Finding the cause of peripheral neuropathy can be hard. To figure that out, your health care provider will take a medical history and do a physical exam. A neurological exam will also be done. This involves checking things affected by your brain, spinal cord, and nerves (nervous system). For example, your health care provider will check your reflexes, how you move, and what you can feel.  Other types of tests may also be ordered, such as:  Blood tests.  A test of the fluid in your spinal cord.  Imaging tests, such as CT scans or an MRI.  Electromyography (EMG). This test checks the nerves that control muscles.  Nerve conduction velocity tests. These tests check how fast messages pass through your nerves.  Nerve biopsy. A small  piece of nerve is removed. It is then checked under a microscope. TREATMENT   Medicine is often used to treat peripheral neuropathy. Medicines may include:  Pain-relieving medicines. Prescription or over-the-counter medicine may be suggested.  Antiseizure medicine. This may be used for pain.  Antidepressants. These also may help ease pain from neuropathy.  Lidocaine. This is a numbing medicine. You might wear a patch or be given a shot.  Mexiletine. This medicine is typically used to help control irregular heart rhythms.  Surgery. Surgery may be needed to relieve pressure on a nerve or to destroy a nerve that is causing pain.  Physical therapy to help movement.  Assistive devices to help movement. HOME CARE INSTRUCTIONS   Only take over-the-counter or prescription medicines as directed by your health care provider. Follow the instructions carefully for any given medicines. Do not take any other medicines without first getting approval from your health care provider.  If you have diabetes, work closely with your health care provider to keep your blood sugar under control.  If you have numbness in your feet:  Check every day for signs of injury or infection. Watch for redness, warmth, and swelling.  Wear padded socks and comfortable shoes. These help protect  your feet.  Do not do things that put pressure on your damaged nerve.  Do not smoke. Smoking keeps blood from getting to damaged nerves.  Avoid or limit alcohol. Too much alcohol can cause a lack of B vitamins. These vitamins are needed for healthy nerves.  Develop a good support system. Coping with peripheral neuropathy can be stressful. Talk to a mental health specialist or join a support group if you are struggling.  Follow up with your health care provider as directed. SEEK MEDICAL CARE IF:   You have new signs or symptoms of peripheral neuropathy.  You are struggling emotionally from dealing with peripheral  neuropathy.  You have a fever. SEEK IMMEDIATE MEDICAL CARE IF:   You have an injury or infection that is not healing.  You feel very dizzy or begin vomiting.  You have chest pain.  You have trouble breathing.   This information is not intended to replace advice given to you by your health care provider. Make sure you discuss any questions you have with your health care provider.   Document Released: 08/24/2002 Document Revised: 05/16/2011 Document Reviewed: 05/11/2013 Elsevier Interactive Patient Education [employer].     I personally performed the services described in this documentation, which was scribed in my presence. The recorded information has been reviewed and considered, and addended by me as needed.

## 2015-12-21 NOTE — Patient Instructions (Addendum)
IF you received an x-ray today, you will receive an invoice from [facility]. Please contact [facility] at [phone] with questions or concerns regarding your invoice.   IF you received labwork today, you will receive an invoice from [employer]. Please contact [facility] at [phone] with questions or concerns regarding your invoice.   Our billing staff will not be able to assist you with questions regarding bills from these companies.  You will be contacted with the lab results as soon as they are available. The fastest way to get your results is to activate your My Chart account. Instructions are located on the last page of this paperwork. If you have not heard from [place] regarding the results in 2 weeks, please contact this office.    For dry eye - can try Systane, Blink, or artificial tears over the counter. If these do not help - return to discuss further, or follow up with eye care provider.   I will refer you to dermatology for skin check and to discuss rash with sun exposure.   PSA has increase some this past year - return for repeat test in 6 months, sooner if any new urinary symptoms.  Call your eye care provider for appointment.   i will check a thyroid test and refer you to neurologist for neuropathy in feet.   Your liver tests looked ok on labs last week, but with your upper abdominal pain - will check ultrasound.  Follow up in next few weeks to discuss this further if persists and to finish discussing other items from today, including bloating, and ringing in ears.   Return to the clinic or go to the nearest emergency room if any of your symptoms worsen or new symptoms occur.  See handout on advanced directives and let me know if we can help you with this paperwork.   Keeping you healthy  Get these tests  Blood pressure- Have your blood pressure checked once a year by your healthcare provider.  Normal blood pressure is  120/80  Weight- Have your body mass index (BMI) calculated to screen for obesity.  BMI is a measure of body fat based on height and weight. You can also calculate your own BMI at ViewBanking.[email or web address].  Cholesterol- Have your cholesterol checked every year.  Diabetes- Have your blood sugar checked regularly if you have high blood pressure, high cholesterol, have a family history of diabetes or if you are overweight.  Screening for Colon Cancer- Colonoscopy starting at age 37.  Screening may begin sooner depending on your family history and other health conditions. Follow up colonoscopy as directed by your Gastroenterologist.  Screening for Prostate Cancer- Both blood work (PSA) and a rectal exam help screen for Prostate Cancer.  Screening begins at age 62 with African-American men and at age 71 with Caucasian men.  Screening may begin sooner depending on your family history.  Take these medicines  Aspirin- One aspirin daily can help prevent Heart disease and Stroke.  Flu shot- Every fall.  Tetanus- Every 10 years.  Zostavax- Once after the age of 14 to prevent Shingles.  Pneumonia shot- Once after the age of 50; if you are younger than 92, ask your healthcare provider if you need a Pneumonia shot.  Take these steps  Don't smoke- If you do smoke, talk to your doctor about quitting.  For tips on how to quit, go to www.smokefree.gov or call 1-800-QUIT-NOW.  Be physically active- Exercise 5 days a week for at  least 30 minutes.  If you are not already physically active start slow and gradually work up to 30 minutes of moderate physical activity.  Examples of moderate activity include walking briskly, mowing the yard, dancing, swimming, bicycling, etc.  Eat a healthy diet- Eat a variety of healthy food such as fruits, vegetables, low fat milk, low fat cheese, yogurt, lean meant, poultry, fish, beans, tofu, etc. For more information go to www.thenutritionsource.org  Drink alcohol in  moderation- Limit alcohol intake to less than two drinks a day. Never drink and drive.  Dentist- Brush and floss twice daily; visit your dentist twice a year.  Depression- Your emotional health is as important as your physical health. If you're feeling down, or losing interest in things you would normally enjoy please talk to your healthcare provider.  Eye exam- Visit your eye doctor every year.  Safe sex- If you may be exposed to a sexually transmitted infection, use a condom.  Seat belts- Seat belts can save your life; always wear one.  Smoke/Carbon Monoxide detectors- These detectors need to be installed on the appropriate level of your home.  Replace batteries at least once a year.  Skin cancer- When out in the sun, cover up and use sunscreen 15 SPF or higher.  Violence- If anyone is threatening you, please tell your healthcare provider.  Living Will/ Health care power of attorney- Speak with your healthcare provider and family.  Abdominal Pain, Adult Many things can cause abdominal pain. Usually, abdominal pain is not caused by a disease and will improve without treatment. It can often be observed and treated at home. Your health care provider will do a physical exam and possibly order blood tests and X-rays to help determine the seriousness of your pain. However, in many cases, more time must pass before a clear cause of the pain can be found. Before that point, your health care provider may not know if you need more testing or further treatment. HOME CARE INSTRUCTIONS Monitor your abdominal pain for any changes. The following actions may help to alleviate any discomfort you are experiencing:  Only take over-the-counter or prescription medicines as directed by your health care provider.  Do not take laxatives unless directed to do so by your health care provider.  Try a clear liquid diet (broth, tea, or water) as directed by your health care provider. Slowly move to a bland diet as  tolerated. SEEK MEDICAL CARE IF:  You have unexplained abdominal pain.  You have abdominal pain associated with nausea or diarrhea.  You have pain when you urinate or have a bowel movement.  You experience abdominal pain that wakes you in the night.  You have abdominal pain that is worsened or improved by eating food.  You have abdominal pain that is worsened with eating fatty foods.  You have a fever. SEEK IMMEDIATE MEDICAL CARE IF:  Your pain does not go away within 2 hours.  You keep throwing up (vomiting).  Your pain is felt only in portions of the abdomen, such as the right side or the left lower portion of the abdomen.  You pass bloody or black tarry stools. MAKE SURE YOU:  Understand these instructions.  Will watch your condition.  Will get help right away if you are not doing well or get worse.   This information is not intended to replace advice given to you by your health care provider. Make sure you discuss any questions you have with your health care provider.  Document Released: 06/13/2005 Document Revised: 05/25/2015 Document Reviewed: 05/13/2013 Elsevier Interactive Patient Education 2016 Elsevier Inc.   Peripheral Neuropathy Peripheral neuropathy is a type of nerve damage. It affects nerves that carry signals between the spinal cord and other parts of the body. These are called peripheral nerves. With peripheral neuropathy, one nerve or a group of nerves may be damaged.  CAUSES  Many things can damage peripheral nerves. For some people with peripheral neuropathy, the cause is unknown. Some causes include:  Diabetes. This is the most common cause of peripheral neuropathy.  Injury to a nerve.  Pressure or stress on a nerve that lasts a long time.  Too little vitamin B. Alcoholism can lead to this.  Infections.  Autoimmune diseases, such as multiple sclerosis and systemic lupus erythematosus.  Inherited nerve diseases.  Some medicines, such as  cancer drugs.  Toxic substances, such as lead and mercury.  Too little blood flowing to the legs.  Kidney disease.  Thyroid disease. SIGNS AND SYMPTOMS  Different people have different symptoms. The symptoms you have will depend on which of your nerves is damaged. Common symptoms include:  Loss of feeling (numbness) in the feet and hands.  Tingling in the feet and hands.  Pain that burns.  Very sensitive skin.  Weakness.  Not being able to move a part of the body (paralysis).  Muscle twitching.  Clumsiness or poor coordination.  Loss of balance.  Not being able to control your bladder.  Feeling dizzy.  Sexual problems. DIAGNOSIS  Peripheral neuropathy is a symptom, not a disease. Finding the cause of peripheral neuropathy can be hard. To figure that out, your health care provider will take a medical history and do a physical exam. A neurological exam will also be done. This involves checking things affected by your brain, spinal cord, and nerves (nervous system). For example, your health care provider will check your reflexes, how you move, and what you can feel.  Other types of tests may also be ordered, such as:  Blood tests.  A test of the fluid in your spinal cord.  Imaging tests, such as CT scans or an MRI.  Electromyography (EMG). This test checks the nerves that control muscles.  Nerve conduction velocity tests. These tests check how fast messages pass through your nerves.  Nerve biopsy. A small piece of nerve is removed. It is then checked under a microscope. TREATMENT   Medicine is often used to treat peripheral neuropathy. Medicines may include:  Pain-relieving medicines. Prescription or over-the-counter medicine may be suggested.  Antiseizure medicine. This may be used for pain.  Antidepressants. These also may help ease pain from neuropathy.  Lidocaine. This is a numbing medicine. You might wear a patch or be given a shot.  Mexiletine. This  medicine is typically used to help control irregular heart rhythms.  Surgery. Surgery may be needed to relieve pressure on a nerve or to destroy a nerve that is causing pain.  Physical therapy to help movement.  Assistive devices to help movement. HOME CARE INSTRUCTIONS   Only take over-the-counter or prescription medicines as directed by your health care provider. Follow the instructions carefully for any given medicines. Do not take any other medicines without first getting approval from your health care provider.  If you have diabetes, work closely with your health care provider to keep your blood sugar under control.  If you have numbness in your feet:  Check every day for signs of injury or infection. Watch for redness,  warmth, and swelling.  Wear padded socks and comfortable shoes. These help protect your feet.  Do not do things that put pressure on your damaged nerve.  Do not smoke. Smoking keeps blood from getting to damaged nerves.  Avoid or limit alcohol. Too much alcohol can cause a lack of B vitamins. These vitamins are needed for healthy nerves.  Develop a good support system. Coping with peripheral neuropathy can be stressful. Talk to a mental health specialist or join a support group if you are struggling.  Follow up with your health care provider as directed. SEEK MEDICAL CARE IF:   You have new signs or symptoms of peripheral neuropathy.  You are struggling emotionally from dealing with peripheral neuropathy.  You have a fever. SEEK IMMEDIATE MEDICAL CARE IF:   You have an injury or infection that is not healing.  You feel very dizzy or begin vomiting.  You have chest pain.  You have trouble breathing.   This information is not intended to replace advice given to you by your health care provider. Make sure you discuss any questions you have with your health care provider.   Document Released: 08/24/2002 Document Revised: 05/16/2011 Document Reviewed:  05/11/2013 Elsevier Interactive Patient Education [employer].

## 2015-12-22 LAB — TSH: TSH: 0.96 m[IU]/L (ref 0.40–4.50)

## 2015-12-23 ENCOUNTER — Ambulatory Visit (INDEPENDENT_AMBULATORY_CARE_PROVIDER_SITE_OTHER): Payer: Medicare HMO | Admitting: Neurology

## 2015-12-23 ENCOUNTER — Encounter: Payer: Self-pay | Admitting: Family Medicine

## 2015-12-23 ENCOUNTER — Encounter: Payer: Self-pay | Admitting: Neurology

## 2015-12-23 VITALS — BP 124/80 | HR 76 | Ht 70.5 in | Wt 149.2 lb

## 2015-12-23 DIAGNOSIS — E538 Deficiency of other specified B group vitamins: Secondary | ICD-10-CM

## 2015-12-23 DIAGNOSIS — G609 Hereditary and idiopathic neuropathy, unspecified: Secondary | ICD-10-CM | POA: Insufficient documentation

## 2015-12-23 NOTE — Patient Instructions (Signed)
Increase vitamin B12 to 1088mcg daily Return to clinic as needed

## 2015-12-23 NOTE — Progress Notes (Signed)
[facility] Clinic Note - Initial Visit   Date: 12/23/2015  Frederick Fitzgerald MRN: [id number] DOB: October 08, 1934   Dear Dr. Carlota Raspberry:  Thank you for your kind referral of Frederick Fitzgerald for consultation of neuropathy. Although his history is well known to you, please allow [place] to reiterate it for the purpose of our medical record. The patient was accompanied to the clinic by self.    History of Present Illness: Frederick Fitzgerald is a 81 y.o. 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  Starting around 2005, he began developing numbness and tingling in the toes and soles of the feet.  Over the years, his paresthesias have involved his lower legs, worse on the left and become constant.  He denies any falls and walks independently.   He does not have any weakness or imbalance.  He started having numbness in the left wrist a few months ago, but spares the fingertips.  He denies history of alcoholism or diabetes.  There is no family history of neuropathy.    He has had several injuries to his low back back pain.    Out-side paper records, electronic medical record, and images have been reviewed where available and summarized as:  XR lumbar spine 12/21/2015:  Prominent scoliosis concave right with diffuse severe degenerative change. No acute bony abnormality identified.  Lab Results  Component Value Date   [id number] 12/15/2014   Lab Results  Component Value Date   TSH 0.96 12/21/2015     Past Medical History  Diagnosis Date  . GERD (gastroesophageal reflux disease)   . Neuropathy [facility])     Past Surgical History  Procedure Laterality Date  . Hernia repair       Medications:  Outpatient Encounter Prescriptions as of 12/23/2015  Medication Sig  . [DISCONTINUED] triamcinolone ointment (KENALOG) 0.1 % Apply topically 2 (two) times daily. (Patient not  taking: Reported on 12/15/2014)   No facility-administered encounter medications on file as of 12/23/2015.     Allergies: No Known Allergies  Family History: Family History  Problem Relation Age of Onset  . Heart failure Mother     Deceased  . AAA (abdominal aortic aneurysm) Father     Deceased  . Heart disease Brother     Deceased, 28    Social History: Social History  Substance Use Topics  . Smoking status: Light Tobacco Smoker  . Smokeless tobacco: Never Used  . Alcohol Use: No   Social History   Social History Narrative   Lives alone in a one story home.  No children.     Retired.  Did work delivering copy machines.     Education: some college.     Review of Systems:  CONSTITUTIONAL: No fevers, chills, night sweats, or weight loss.   EYES: No visual changes or eye pain ENT: No hearing changes.  No history of nose bleeds.   RESPIRATORY: No cough, wheezing and shortness of breath.   CARDIOVASCULAR: Negative for chest pain, and palpitations.   GI: Negative for abdominal discomfort, blood in stools or black stools.  No recent change in bowel habits.   GU:  No history of incontinence.   MUSCLOSKELETAL: No history of joint pain or swelling.  No myalgias.   SKIN: Negative for lesions, rash, and itching.   HEMATOLOGY/ONCOLOGY: Negative for prolonged bleeding, bruising easily, and swollen nodes.  No history of cancer.   ENDOCRINE:  Negative for cold or heat intolerance, polydipsia or goiter.   PSYCH:  No depression or anxiety symptoms.   NEURO: As Above.   Vital Signs:  BP 124/80 mmHg  Pulse 76  Ht 5' 10.5" (1.791 m)  Wt 149 lb 4 oz (67.699 kg)  BMI 21.11 kg/m2 Pain Scale: 0 on a scale of 0-10   General Medical Exam:   General:  Well appearing, comfortable.   Eyes/ENT: see cranial nerve examination.   Neck: No masses appreciated.  Full range of motion without tenderness.  No carotid bruits. Respiratory:  Clear to auscultation, good air entry bilaterally.     Cardiac:  Regular rate and rhythm, no murmur.   Extremities:  No deformities, edema, or skin discoloration.  Skin:  No rashes or lesions.  Neurological Exam: MENTAL STATUS including orientation to time, place, person, recent and remote memory, attention span and concentration, language, and fund of knowledge is normal.  Speech is not dysarthric.  CRANIAL NERVES: II:  No visual field defects on the right, he is able to see light and gross movements on the left eye only.  Limited fundoscopic exam due to small pupils bilaterally.    III-IV-VI: Pupils equal round and reactive to light.  Normal conjugate, extra-ocular eye movements in all directions of gaze.  No nystagmus.  Prominent left ptosis.   V:  Normal facial sensation.   VII:  Normal facial symmetry and movements.    VIII:  Normal hearing and vestibular function.   IX-X:  Normal palatal movement.   XI:  Normal shoulder shrug and head rotation.   XII:  Normal tongue strength and range of motion, no deviation or fasciculation.  MOTOR:  Generalized loss of muscle bulk. No fasciculations or abnormal movements.  No pronator drift.  Tone is normal.    Right Upper Extremity:    Left Upper Extremity:    Deltoid  5/5   Deltoid  5/5   Biceps  5/5   Biceps  5/5   Triceps  5/5   Triceps  5/5   Wrist extensors  5/5   Wrist extensors  5/5   Wrist flexors  5/5   Wrist flexors  5/5   Finger extensors  5/5   Finger extensors  5/5   Finger flexors  5/5   Finger flexors  5/5   Dorsal interossei  5/5   Dorsal interossei  5/5   Abductor pollicis  5/5   Abductor pollicis  5/5   Tone (Ashworth scale)  0  Tone (Ashworth scale)  0   Right Lower Extremity:    Left Lower Extremity:    Hip flexors  5/5   Hip flexors  5/5   Hip extensors  5/5   Hip extensors  5/5   Knee flexors  5/5   Knee flexors  5/5   Knee extensors  5/5   Knee extensors  5/5   Dorsiflexors  5/5   Dorsiflexors  5/5   Plantarflexors  5/5   Plantarflexors  5/5   Toe extensors  5/5    Toe extensors  5/5   Toe flexors  5-/5   Toe flexors  5-/5   Tone (Ashworth scale)  0  Tone (Ashworth scale)  0   MSRs:  Right  Left brachioradialis 1+  brachioradialis 1+  biceps 1+  biceps 1+  triceps 1+  triceps 1+  patellar 1+  patellar 1+  ankle jerk 0  ankle jerk 0  Hoffman no  Hoffman no  plantar response down  plantar response down   SENSORY:  Vibration is absent at the right knee, reduced to 50% at the left knee, and absent distal to ankles.  Temperature and pin prick is reduced in the lower legs.  Vibration is also reduced at the MCP bilaterally as compared to the wrist.  There is moderate sway with Rhomberg testing.  COORDINATION/GAIT: Normal finger-to- nose-finger and heel-to-shin.  Intact rapid alternating movements bilaterally.  Able to rise from a chair without using arms.  Gait narrow based and stable. Stressed gait intact.  He is unsteady with tandem gait.    IMPRESSION: Frederick Fitzgerald is a 81 year-old gentleman referred for evaluation of at least 10+ year history of bilateral feet paresthesias.  His neurological examination shows a glove-stocking distribution of paresthesias and hyporeflexia in the lower extremities consistent with  peripheral neuropathy. I had extensive discussion with the patient regarding the pathogenesis, etiology, management, and natural course of neuropathy. Neuropathy tends to be slowly progressive, especially if a treatable etiology is not identified. There is no history of alcoholism, diabetes or thyroid disease.  He has been tested for vitamin B12 previously which was low-normal and I recommended retesting additional labs, but declined lab testing as well as NCS/EMG.  I anticipate that he has vitamin B12 deficiency and possibly malabsorption because he has been on oral supplementation for quite some time, being that he is vegetarian.  He prefers to increase vitamin B12 to 1040mcg daily and  see how symptoms evolve. He most likely has idiopathic neuropathy and explained that management is symptomatic.  His paresthesias are not bothersome enough to be on any neuralgesic medication  He will return to clinic as needed.   The duration of this appointment visit was 45 minutes of face-to-face time with the patient.  Greater than 50% of this time was spent in counseling, explanation of diagnosis, planning of further management, and coordination of care.   Thank you for allowing me to participate in patient's care.  If I can answer any additional questions, I would be pleased to do so.    Sincerely,    Sherian Valenza K. Posey Pronto, DO

## 2015-12-29 ENCOUNTER — Ambulatory Visit
Admission: RE | Admit: 2015-12-29 | Discharge: 2015-12-29 | Disposition: A | Discharge status: Home / Self Care | Payer: Medicare HMO | Source: Ambulatory Visit | Attending: Family Medicine | Admitting: Family Medicine

## 2015-12-29 DIAGNOSIS — R1011 Right upper quadrant pain: Secondary | ICD-10-CM

## 2016-01-18 DIAGNOSIS — H3589 Other specified retinal disorders: Secondary | ICD-10-CM | POA: Diagnosis not present

## 2016-01-18 DIAGNOSIS — H2513 Age-related nuclear cataract, bilateral: Secondary | ICD-10-CM | POA: Diagnosis not present

## 2016-01-18 DIAGNOSIS — H40023 Open angle with borderline findings, high risk, bilateral: Secondary | ICD-10-CM | POA: Diagnosis not present

## 2016-01-18 DIAGNOSIS — H25813 Combined forms of age-related cataract, bilateral: Secondary | ICD-10-CM | POA: Diagnosis not present

## 2016-01-18 DIAGNOSIS — H02402 Unspecified ptosis of left eyelid: Secondary | ICD-10-CM | POA: Diagnosis not present

## 2016-01-18 DIAGNOSIS — H53002 Unspecified amblyopia, left eye: Secondary | ICD-10-CM | POA: Diagnosis not present

## 2016-01-18 DIAGNOSIS — H52221 Regular astigmatism, right eye: Secondary | ICD-10-CM | POA: Diagnosis not present

## 2016-01-18 DIAGNOSIS — H11423 Conjunctival edema, bilateral: Secondary | ICD-10-CM | POA: Diagnosis not present

## 2016-01-18 DIAGNOSIS — H353230 Exudative age-related macular degeneration, bilateral, stage unspecified: Secondary | ICD-10-CM | POA: Diagnosis not present

## 2016-01-18 DIAGNOSIS — H5211 Myopia, right eye: Secondary | ICD-10-CM | POA: Diagnosis not present

## 2016-01-31 DIAGNOSIS — H353134 Nonexudative age-related macular degeneration, bilateral, advanced atrophic with subfoveal involvement: Secondary | ICD-10-CM | POA: Diagnosis not present

## 2016-01-31 DIAGNOSIS — H16222 Keratoconjunctivitis sicca, not specified as Sjogren's, left eye: Secondary | ICD-10-CM | POA: Diagnosis not present

## 2016-01-31 DIAGNOSIS — H2513 Age-related nuclear cataract, bilateral: Secondary | ICD-10-CM | POA: Diagnosis not present

## 2016-02-01 DIAGNOSIS — D1801 Hemangioma of skin and subcutaneous tissue: Secondary | ICD-10-CM | POA: Diagnosis not present

## 2016-02-01 DIAGNOSIS — L905 Scar conditions and fibrosis of skin: Secondary | ICD-10-CM | POA: Diagnosis not present

## 2016-02-01 DIAGNOSIS — L814 Other melanin hyperpigmentation: Secondary | ICD-10-CM | POA: Diagnosis not present

## 2016-02-01 DIAGNOSIS — D225 Melanocytic nevi of trunk: Secondary | ICD-10-CM | POA: Diagnosis not present

## 2016-02-01 DIAGNOSIS — L821 Other seborrheic keratosis: Secondary | ICD-10-CM | POA: Diagnosis not present

## 2016-02-01 DIAGNOSIS — L570 Actinic keratosis: Secondary | ICD-10-CM | POA: Diagnosis not present

## 2016-02-01 DIAGNOSIS — L563 Solar urticaria: Secondary | ICD-10-CM | POA: Diagnosis not present

## 2016-02-01 DIAGNOSIS — L820 Inflamed seborrheic keratosis: Secondary | ICD-10-CM | POA: Diagnosis not present

## 2016-02-08 ENCOUNTER — Encounter: Payer: Self-pay | Admitting: Family Medicine

## 2016-02-08 ENCOUNTER — Encounter (INDEPENDENT_AMBULATORY_CARE_PROVIDER_SITE_OTHER): Payer: Medicare HMO | Admitting: Ophthalmology

## 2016-02-08 ENCOUNTER — Ambulatory Visit (INDEPENDENT_AMBULATORY_CARE_PROVIDER_SITE_OTHER): Payer: Medicare HMO | Admitting: Family Medicine

## 2016-02-08 VITALS — BP 126/76 | HR 65 | Temp 98.0°F | Resp 16 | Ht 71.5 in | Wt 150.0 lb

## 2016-02-08 DIAGNOSIS — R140 Abdominal distension (gaseous): Secondary | ICD-10-CM | POA: Diagnosis not present

## 2016-02-08 DIAGNOSIS — H353134 Nonexudative age-related macular degeneration, bilateral, advanced atrophic with subfoveal involvement: Secondary | ICD-10-CM

## 2016-02-08 DIAGNOSIS — G609 Hereditary and idiopathic neuropathy, unspecified: Secondary | ICD-10-CM

## 2016-02-08 DIAGNOSIS — H43813 Vitreous degeneration, bilateral: Secondary | ICD-10-CM | POA: Diagnosis not present

## 2016-02-08 DIAGNOSIS — R1084 Generalized abdominal pain: Secondary | ICD-10-CM

## 2016-02-08 DIAGNOSIS — H2513 Age-related nuclear cataract, bilateral: Secondary | ICD-10-CM | POA: Diagnosis not present

## 2016-02-08 LAB — VITAMIN B12: Vitamin B-12: 640 pg/mL (ref 200–1100)

## 2016-02-08 NOTE — Progress Notes (Signed)
By signing my name below, I, [name], attest that this documentation has been prepared under the direction and in the presence of [name], MD.  Electronically Signed: [name], Medical Scribe. 02/08/2016. 3:23 PM. Subjective:    Patient ID: Frederick Fitzgerald, male    DOB: June 01, 1935, 81 y.o.   MRN: [id number]  HPI Chief Complaint  Patient presents with  . Follow-up    6 wks  . Peripheral neuropathy    seen [name] on 12/23/15, pt states" doctor recognizes but nothing she can do for it,  pt was advised to take more Vit B 12  . Low Vit B 12    HPI Comments: Frederick Fitzgerald is a 81 y.o. male who presents to the [facility] for a follow-up for peripheral neuropathy and abdominal bloating. He was seen April 5th for medicare wellness visit with multiple concerns. Noted to have peripheral neuropathy, was referred to neurology with [name] April 7th. Noted low normal Vit B12, but declined repeat testing as well as NCS/EMG. Pt planned to increase vitamin B12 to 1000 mcg QD. No other medication recommend based on level of his symptoms. He had a nl TSH, and the last B12 here was 243 of March 2016. Pt mentions his feet get really warm sometimes. Pt states he had his Vit B12 recheck a bout a month ago. Pt doesn't recall any changes in his symptoms since taking B12 supplements, but doesn't think he needs any medication to relieve him of his symptoms. Pt wants to stay away from taking gavapenton medication if he doesn't need it. Pt reports feeling numbness, and some tingleing in his hands and feet.  Abdominal bloating: See last visit. He had an ultra sound on April that was nl, and he had a nl CMP. He had nl liver test. Pt reports discomfort from bloating onset a couple of years. Pt reports the abdominal pain located that sometimes radiates to his lower flanks was there off and on since February even when the bloating isn't present. Pt mentions having aches and pains  on on side of his abdomen. Pt would loosens his clothing for relief, not it no longer subsides the discomfort. Pt doesn't know if it's triggered by his posture or what he's eating. Pt states he doesn't eat anything his stomach can't handle. Pt mentions he's a vegetarian that eats cheese on occasion and sometimes cooks with eggs. Pt reports having a colonoscopy in 2015 by [name] with [facility] on [facility] by [employer]. Pt denies nausea, vomiting, weight loss, and night sweats.  Borderline Elevated PSA: PSA inc from 2.8 in March 2016 to 3.19 in March 2017 - recommended recheck PSA in 6 months.  Patient Active Problem List   Diagnosis Date Noted  . Hereditary and idiopathic peripheral neuropathy 12/23/2015   Past Medical History  Diagnosis Date  . GERD (gastroesophageal reflux disease)   . Neuropathy [facility])    Past Surgical History  Procedure Laterality Date  . Hernia repair     No Known Allergies Prior to Admission medications   Not on File   Social History   Social History  . Marital Status: Single    Spouse Name: N/A  . Number of Children: N/A  . Years of Education: N/A   Occupational History  . Not on file.   Social History Main Topics  . Smoking status: Light Tobacco Smoker  . Smokeless tobacco: Never Used  . Alcohol Use: No  .  Drug Use: No  . Sexual Activity: Not on file   Other Topics Concern  . Not on file   Social History Narrative   Lives alone in a one story home.  No children.     Retired.  Did work delivering copy machines.     Education: some college.    Review of Systems  Constitutional: Negative for diaphoresis (night sweats) and unexpected weight change (weight loss).  Gastrointestinal: Negative for nausea and vomiting.   Objective:  BP 126/76 mmHg  Pulse 65  Temp(Src) 98 F (36.7 C) (Oral)  Resp 16  Ht 5' 11.5" (1.816 m)  Wt 150 lb (68.04 kg)  BMI 20.63 kg/m2  SpO2 98%  Physical Exam  Constitutional: He appears well-developed and  well-nourished. No distress.  HENT:  Head: Normocephalic and atraumatic.  Eyes: Conjunctivae are normal.  Neck: Neck supple.  Cardiovascular: Normal rate.   Murmur heard.  Systolic murmur is present with a grade of 2/6  2/6 systolic murmur- chronic and was told it was fine  Pulmonary/Chest: Effort normal.  Abdominal: Soft. Bowel sounds are normal. He exhibits no distension. There is no tenderness.  Neurological: He is alert.  Skin: Skin is warm and dry.  Psychiatric: He has a normal mood and affect. His behavior is normal.  Nursing note and vitals reviewed.  Assessment & Plan:  Frederick Fitzgerald is a 81 y.o. male Hereditary and idiopathic peripheral neuropathy - Plan: Vitamin B12  - Repeat vitamin B12. Results noted, normal. He declined any medication for now, but discussed meds such as gabapentin are available if he would like to try them.  Abdominal bloating - Plan: Ambulatory referral to Gastroenterology Abdominal pain, generalized - Plan: Ambulatory referral to Gastroenterology  -Recurrent issue, but episodic worsening. Refer to gastroenterology to see if other workup needed. Reassuring ultrasound and lab work from previous visit. RTC/ER precautions if acute worsening.  No orders of the defined types were placed in this encounter.   Patient Instructions     IF you received an x-ray today, you will receive an invoice from [facility]. Please contact [facility] at [phone] with questions or concerns regarding your invoice.   IF you received labwork today, you will receive an invoice from [employer]. Please contact [facility] at [phone] with questions or concerns regarding your invoice.   Our billing staff will not be able to assist you with questions regarding bills from these companies.  You will be contacted with the lab results as soon as they are available. The fastest way to get your results is to activate your My Chart  account. Instructions are located on the last page of this paperwork. If you have not heard from [place] regarding the results in 2 weeks, please contact this office.    If you want to try a medication to help with burning in hands and feet (gabapentin), let me know or can discuss this with neurologist. I will recheck B12 level. Return to the clinic or go to the nearest emergency room if any of your symptoms worsen or new symptoms occur.  I will refer you back to gastroenterology to discuss bloating and abdominal pains.   Return to the clinic or go to the nearest emergency room if any of your symptoms worsen or new symptoms occur.  Abdominal Pain, Adult Many things can cause abdominal pain. Usually, abdominal pain is not caused by a disease and will improve without treatment. It can often be observed and treated at  home. Your health care provider will do a physical exam and possibly order blood tests and X-rays to help determine the seriousness of your pain. However, in many cases, more time must pass before a clear cause of the pain can be found. Before that point, your health care provider may not know if you need more testing or further treatment. HOME CARE INSTRUCTIONS Monitor your abdominal pain for any changes. The following actions may help to alleviate any discomfort you are experiencing:  Only take over-the-counter or prescription medicines as directed by your health care provider.  Do not take laxatives unless directed to do so by your health care provider.  Try a clear liquid diet (broth, tea, or water) as directed by your health care provider. Slowly move to a bland diet as tolerated. SEEK MEDICAL CARE IF:  You have unexplained abdominal pain.  You have abdominal pain associated with nausea or diarrhea.  You have pain when you urinate or have a bowel movement.  You experience abdominal pain that wakes you in the night.  You have abdominal pain that is worsened or improved by  eating food.  You have abdominal pain that is worsened with eating fatty foods.  You have a fever. SEEK IMMEDIATE MEDICAL CARE IF:  Your pain does not go away within 2 hours.  You keep throwing up (vomiting).  Your pain is felt only in portions of the abdomen, such as the right side or the left lower portion of the abdomen.  You pass bloody or black tarry stools. MAKE SURE YOU:  Understand these instructions.  Will watch your condition.  Will get help right away if you are not doing well or get worse.   This information is not intended to replace advice given to you by your health care provider. Make sure you discuss any questions you have with your health care provider.   Document Released: 06/13/2005 Document Revised: 05/25/2015 Document Reviewed: 05/13/2013 Elsevier Interactive Patient Education 2016 Elsevier Inc.  Peripheral Neuropathy Peripheral neuropathy is a type of nerve damage. It affects nerves that carry signals between the spinal cord and other parts of the body. These are called peripheral nerves. With peripheral neuropathy, one nerve or a group of nerves may be damaged.  CAUSES  Many things can damage peripheral nerves. For some people with peripheral neuropathy, the cause is unknown. Some causes include:  Diabetes. This is the most common cause of peripheral neuropathy.  Injury to a nerve.  Pressure or stress on a nerve that lasts a long time.  Too little vitamin B. Alcoholism can lead to this.  Infections.  Autoimmune diseases, such as multiple sclerosis and systemic lupus erythematosus.  Inherited nerve diseases.  Some medicines, such as cancer drugs.  Toxic substances, such as lead and mercury.  Too little blood flowing to the legs.  Kidney disease.  Thyroid disease. SIGNS AND SYMPTOMS  Different people have different symptoms. The symptoms you have will depend on which of your nerves is damaged. Common symptoms include:  Loss of feeling  (numbness) in the feet and hands.  Tingling in the feet and hands.  Pain that burns.  Very sensitive skin.  Weakness.  Not being able to move a part of the body (paralysis).  Muscle twitching.  Clumsiness or poor coordination.  Loss of balance.  Not being able to control your bladder.  Feeling dizzy.  Sexual problems. DIAGNOSIS  Peripheral neuropathy is a symptom, not a disease. Finding the cause of peripheral neuropathy can be  hard. To figure that out, your health care provider will take a medical history and do a physical exam. A neurological exam will also be done. This involves checking things affected by your brain, spinal cord, and nerves (nervous system). For example, your health care provider will check your reflexes, how you move, and what you can feel.  Other types of tests may also be ordered, such as:  Blood tests.  A test of the fluid in your spinal cord.  Imaging tests, such as CT scans or an MRI.  Electromyography (EMG). This test checks the nerves that control muscles.  Nerve conduction velocity tests. These tests check how fast messages pass through your nerves.  Nerve biopsy. A small piece of nerve is removed. It is then checked under a microscope. TREATMENT   Medicine is often used to treat peripheral neuropathy. Medicines may include:  Pain-relieving medicines. Prescription or over-the-counter medicine may be suggested.  Antiseizure medicine. This may be used for pain.  Antidepressants. These also may help ease pain from neuropathy.  Lidocaine. This is a numbing medicine. You might wear a patch or be given a shot.  Mexiletine. This medicine is typically used to help control irregular heart rhythms.  Surgery. Surgery may be needed to relieve pressure on a nerve or to destroy a nerve that is causing pain.  Physical therapy to help movement.  Assistive devices to help movement. HOME CARE INSTRUCTIONS   Only take over-the-counter or  prescription medicines as directed by your health care provider. Follow the instructions carefully for any given medicines. Do not take any other medicines without first getting approval from your health care provider.  If you have diabetes, work closely with your health care provider to keep your blood sugar under control.  If you have numbness in your feet:  Check every day for signs of injury or infection. Watch for redness, warmth, and swelling.  Wear padded socks and comfortable shoes. These help protect your feet.  Do not do things that put pressure on your damaged nerve.  Do not smoke. Smoking keeps blood from getting to damaged nerves.  Avoid or limit alcohol. Too much alcohol can cause a lack of B vitamins. These vitamins are needed for healthy nerves.  Develop a good support system. Coping with peripheral neuropathy can be stressful. Talk to a mental health specialist or join a support group if you are struggling.  Follow up with your health care provider as directed. SEEK MEDICAL CARE IF:   You have new signs or symptoms of peripheral neuropathy.  You are struggling emotionally from dealing with peripheral neuropathy.  You have a fever. SEEK IMMEDIATE MEDICAL CARE IF:   You have an injury or infection that is not healing.  You feel very dizzy or begin vomiting.  You have chest pain.  You have trouble breathing.   This information is not intended to replace advice given to you by your health care provider. Make sure you discuss any questions you have with your health care provider.   Document Released: 08/24/2002 Document Revised: 05/16/2011 Document Reviewed: 05/11/2013 Elsevier Interactive Patient Education [employer].      I personally performed the services described in this documentation, which was scribed in my presence. The recorded information has been reviewed and considered, and addended by me as needed.

## 2016-02-08 NOTE — Patient Instructions (Addendum)
IF you received an x-ray today, you will receive an invoice from [facility]. Please contact [facility] at [phone] with questions or concerns regarding your invoice.   IF you received labwork today, you will receive an invoice from [employer]. Please contact [facility] at [phone] with questions or concerns regarding your invoice.   Our billing staff will not be able to assist you with questions regarding bills from these companies.  You will be contacted with the lab results as soon as they are available. The fastest way to get your results is to activate your My Chart account. Instructions are located on the last page of this paperwork. If you have not heard from [place] regarding the results in 2 weeks, please contact this office.    If you want to try a medication to help with burning in hands and feet (gabapentin), let me know or can discuss this with neurologist. I will recheck B12 level. Return to the clinic or go to the nearest emergency room if any of your symptoms worsen or new symptoms occur.  I will refer you back to gastroenterology to discuss bloating and abdominal pains.   Return to the clinic or go to the nearest emergency room if any of your symptoms worsen or new symptoms occur.  Abdominal Pain, Adult Many things can cause abdominal pain. Usually, abdominal pain is not caused by a disease and will improve without treatment. It can often be observed and treated at home. Your health care provider will do a physical exam and possibly order blood tests and X-rays to help determine the seriousness of your pain. However, in many cases, more time must pass before a clear cause of the pain can be found. Before that point, your health care provider may not know if you need more testing or further treatment. HOME CARE INSTRUCTIONS Monitor your abdominal pain for any changes. The following actions may help to alleviate any discomfort you  are experiencing:  Only take over-the-counter or prescription medicines as directed by your health care provider.  Do not take laxatives unless directed to do so by your health care provider.  Try a clear liquid diet (broth, tea, or water) as directed by your health care provider. Slowly move to a bland diet as tolerated. SEEK MEDICAL CARE IF:  You have unexplained abdominal pain.  You have abdominal pain associated with nausea or diarrhea.  You have pain when you urinate or have a bowel movement.  You experience abdominal pain that wakes you in the night.  You have abdominal pain that is worsened or improved by eating food.  You have abdominal pain that is worsened with eating fatty foods.  You have a fever. SEEK IMMEDIATE MEDICAL CARE IF:  Your pain does not go away within 2 hours.  You keep throwing up (vomiting).  Your pain is felt only in portions of the abdomen, such as the right side or the left lower portion of the abdomen.  You pass bloody or black tarry stools. MAKE SURE YOU:  Understand these instructions.  Will watch your condition.  Will get help right away if you are not doing well or get worse.   This information is not intended to replace advice given to you by your health care provider. Make sure you discuss any questions you have with your health care provider.   Document Released: 06/13/2005 Document Revised: 05/25/2015 Document Reviewed: 05/13/2013 Elsevier Interactive Patient Education 2016 Elsevier Inc.  Peripheral Neuropathy Peripheral neuropathy  is a type of nerve damage. It affects nerves that carry signals between the spinal cord and other parts of the body. These are called peripheral nerves. With peripheral neuropathy, one nerve or a group of nerves may be damaged.  CAUSES  Many things can damage peripheral nerves. For some people with peripheral neuropathy, the cause is unknown. Some causes include:  Diabetes. This is the most common  cause of peripheral neuropathy.  Injury to a nerve.  Pressure or stress on a nerve that lasts a long time.  Too little vitamin B. Alcoholism can lead to this.  Infections.  Autoimmune diseases, such as multiple sclerosis and systemic lupus erythematosus.  Inherited nerve diseases.  Some medicines, such as cancer drugs.  Toxic substances, such as lead and mercury.  Too little blood flowing to the legs.  Kidney disease.  Thyroid disease. SIGNS AND SYMPTOMS  Different people have different symptoms. The symptoms you have will depend on which of your nerves is damaged. Common symptoms include:  Loss of feeling (numbness) in the feet and hands.  Tingling in the feet and hands.  Pain that burns.  Very sensitive skin.  Weakness.  Not being able to move a part of the body (paralysis).  Muscle twitching.  Clumsiness or poor coordination.  Loss of balance.  Not being able to control your bladder.  Feeling dizzy.  Sexual problems. DIAGNOSIS  Peripheral neuropathy is a symptom, not a disease. Finding the cause of peripheral neuropathy can be hard. To figure that out, your health care provider will take a medical history and do a physical exam. A neurological exam will also be done. This involves checking things affected by your brain, spinal cord, and nerves (nervous system). For example, your health care provider will check your reflexes, how you move, and what you can feel.  Other types of tests may also be ordered, such as:  Blood tests.  A test of the fluid in your spinal cord.  Imaging tests, such as CT scans or an MRI.  Electromyography (EMG). This test checks the nerves that control muscles.  Nerve conduction velocity tests. These tests check how fast messages pass through your nerves.  Nerve biopsy. A small piece of nerve is removed. It is then checked under a microscope. TREATMENT   Medicine is often used to treat peripheral neuropathy. Medicines may  include:  Pain-relieving medicines. Prescription or over-the-counter medicine may be suggested.  Antiseizure medicine. This may be used for pain.  Antidepressants. These also may help ease pain from neuropathy.  Lidocaine. This is a numbing medicine. You might wear a patch or be given a shot.  Mexiletine. This medicine is typically used to help control irregular heart rhythms.  Surgery. Surgery may be needed to relieve pressure on a nerve or to destroy a nerve that is causing pain.  Physical therapy to help movement.  Assistive devices to help movement. HOME CARE INSTRUCTIONS   Only take over-the-counter or prescription medicines as directed by your health care provider. Follow the instructions carefully for any given medicines. Do not take any other medicines without first getting approval from your health care provider.  If you have diabetes, work closely with your health care provider to keep your blood sugar under control.  If you have numbness in your feet:  Check every day for signs of injury or infection. Watch for redness, warmth, and swelling.  Wear padded socks and comfortable shoes. These help protect your feet.  Do not do things that put  pressure on your damaged nerve.  Do not smoke. Smoking keeps blood from getting to damaged nerves.  Avoid or limit alcohol. Too much alcohol can cause a lack of B vitamins. These vitamins are needed for healthy nerves.  Develop a good support system. Coping with peripheral neuropathy can be stressful. Talk to a mental health specialist or join a support group if you are struggling.  Follow up with your health care provider as directed. SEEK MEDICAL CARE IF:   You have new signs or symptoms of peripheral neuropathy.  You are struggling emotionally from dealing with peripheral neuropathy.  You have a fever. SEEK IMMEDIATE MEDICAL CARE IF:   You have an injury or infection that is not healing.  You feel very dizzy or begin  vomiting.  You have chest pain.  You have trouble breathing.   This information is not intended to replace advice given to you by your health care provider. Make sure you discuss any questions you have with your health care provider.   Document Released: 08/24/2002 Document Revised: 05/16/2011 Document Reviewed: 05/11/2013 Elsevier Interactive Patient Education [employer].

## 2016-03-13 DIAGNOSIS — K5730 Diverticulosis of large intestine without perforation or abscess without bleeding: Secondary | ICD-10-CM | POA: Diagnosis not present

## 2016-04-05 DIAGNOSIS — H2511 Age-related nuclear cataract, right eye: Secondary | ICD-10-CM | POA: Diagnosis not present

## 2016-08-02 ENCOUNTER — Encounter: Payer: Self-pay | Admitting: Family Medicine

## 2016-08-02 ENCOUNTER — Ambulatory Visit (INDEPENDENT_AMBULATORY_CARE_PROVIDER_SITE_OTHER): Payer: Medicare HMO | Admitting: Family Medicine

## 2016-08-02 VITALS — BP 130/78 | HR 102 | Temp 98.1°F | Resp 16 | Ht 70.5 in | Wt 129.4 lb

## 2016-08-02 DIAGNOSIS — R1084 Generalized abdominal pain: Secondary | ICD-10-CM

## 2016-08-02 DIAGNOSIS — G609 Hereditary and idiopathic neuropathy, unspecified: Secondary | ICD-10-CM | POA: Diagnosis not present

## 2016-08-02 DIAGNOSIS — R972 Elevated prostate specific antigen [PSA]: Secondary | ICD-10-CM

## 2016-08-02 DIAGNOSIS — R634 Abnormal weight loss: Secondary | ICD-10-CM

## 2016-08-02 DIAGNOSIS — R140 Abdominal distension (gaseous): Secondary | ICD-10-CM

## 2016-08-02 DIAGNOSIS — N281 Cyst of kidney, acquired: Secondary | ICD-10-CM | POA: Diagnosis not present

## 2016-08-02 LAB — COMPLETE METABOLIC PANEL WITH GFR
ALT: 15 U/L (ref 9–46)
AST: 21 U/L (ref 10–35)
Albumin: 4.5 g/dL (ref 3.6–5.1)
Alkaline Phosphatase: 44 U/L (ref 40–115)
BUN: 10 mg/dL (ref 7–25)
CALCIUM: 9.9 mg/dL (ref 8.6–10.3)
CHLORIDE: 99 mmol/L (ref 98–110)
CO2: 26 mmol/L (ref 20–31)
CREATININE: 0.71 mg/dL (ref 0.70–1.18)
GFR, EST NON AFRICAN AMERICAN: 89 mL/min (ref >=60)
Glucose, Bld: 102 mg/dL — ABNORMAL HIGH (ref 65–99)
POTASSIUM: 4.1 mmol/L (ref 3.5–5.3)
Sodium: 135 mmol/L (ref 135–146)
Total Bilirubin: 0.6 mg/dL (ref 0.2–1.2)
Total Protein: 7.6 g/dL (ref 6.1–8.1)

## 2016-08-02 LAB — CBC
HEMATOCRIT: 43.2 % (ref 38.5–50.0)
Hemoglobin: 14.6 g/dL (ref 13.2–17.1)
MCH: 30.4 pg (ref 27.0–33.0)
MCHC: 33.8 g/dL (ref 32.0–36.0)
MCV: 90 fL (ref 80.0–100.0)
MPV: 8.9 fL (ref 7.5–12.5)
PLATELETS: 360 10*3/uL (ref 140–400)
RBC: 4.8 MIL/uL (ref 4.20–5.80)
RDW: 13.7 % (ref 11.0–15.0)
WBC: 6.2 10*3/uL (ref 3.8–10.8)

## 2016-08-02 LAB — TSH: TSH: 1.11 m[IU]/L (ref 0.40–4.50)

## 2016-08-02 MED ORDER — GABAPENTIN 100 MG PO CAPS: 100.0000 mg | ORAL_CAPSULE | Freq: Three times a day (TID) | ORAL | 1 refills | Status: DC

## 2016-08-02 NOTE — Progress Notes (Signed)
By signing my name below, I, Frederick Fitzgerald, attest that this documentation has been prepared under the direction and in the presence of Frederick Ray, MD.  Electronically Signed: Verlee Fitzgerald, Medical Scribe. 08/02/16. 12:08 PM.  Subjective:    Patient ID: Frederick Fitzgerald, male    DOB: 1934/09/21, 81 y.o.   MRN: [id number]  HPI Chief Complaint  Patient presents with  . Abdominal Pain    per patient discomfort and pain sometimes on right and left side x months    HPI Comments: Frederick Fitzgerald is a 81 y.o. male who presents to the [facility] complaining of abdominal pain. He was last seen in May for neuropathy and abdominal bloating. He had been complaining of abdominal pain prior to that time and reportedly started around Feb 2017. That abdominal pain and bloat was on and off and located on the sides of his abdomen, not necessarily associated with food. He did have a borderline elevated PSA from 2.23 November 2014 to 3.04 December 2015 with repeat plan in 6 months. He had a nl CMP in March. RUQ ultrasound was nl in April. In review of his chart he had mild bilateral renal cyst, bosniak category 1 and 2 in 2013. Based on his persistent and worsening sxs of bloating at last visit, I referred him to GI. He had reported a prev colonoscopy in 2014 with Dr. Penelope Fitzgerald.  Abdominal Bloating:  same as 3-4 months ago. Pt reports Dr. Penelope Fitzgerald told him he wasn't concerned about his sx's at that time.  Pt mentions he goes to the bathroom 2x a week for the past 3-4 months after being "regular" his whole life. Takes probiotics daily, and OTC tablets after 3 days without a bowel movement for his symptoms. He now has to eat less due to the bloating since everything bloats him, some foods more than others, and wishes to eat more after eating but can't. Reports fatigue causing him to loose energy to do anything, and tightness around the site of his hernia surgery with some "discomfort" on both sites of  his hernia repair; had hernia surgery in the 90s and was told he had a nl colonoscopy when done in 2014. Denies redness around site of surgery. He has nocturia 1-2x a night, but mainly once a night. Denies appetite loss, melena, difficulty urinating, urinary frequency, urinary urgency, hematuria, dysuria, cough, trouble breathing, fever, nausea, vomiting,  Wt Readings from Last 3 Encounters:  08/02/16 129 lb 6.4 oz (58.7 kg)  02/08/16 150 lb (68 kg)  12/23/15 149 lb 4 oz (67.7 kg)   Peripheral Neuropathy: He was evaluated by neurology [name] in April. Thought to be idiopathic and to treat symptomatically, he was also recommended to take D12 supplement as he had prev low nl reading. Last B12 reading 640 in May. I had Rx neurotin in the past, but he did not take medication if he did not need it. He apparently declined lab testing as well as nerve conduction study at neurology. Compliant with B12 supplements. Reports the tingling in is hands has worsened; the bottom of his feet burn all the time.  Patient Active Problem List   Diagnosis Date Noted  . Hereditary and idiopathic peripheral neuropathy 12/23/2015   Past Medical History:  Diagnosis Date  . GERD (gastroesophageal reflux disease)   . Neuropathy Frederick Fitzgerald)    Past Surgical History:  Procedure Laterality Date  . HERNIA REPAIR     No Known Allergies Prior to  Admission medications   Medication Sig Start Date End Date Taking? Authorizing Provider  Cyanocobalamin (VITAMIN B 12 PO) Take 1,000 mg by mouth daily.   Yes Historical Provider, MD  Probiotic Product (PROBIOTIC PO) Take by mouth daily.   Yes Historical Provider, MD   Social History   Social History  . Marital status: Single    Spouse name: N/A  . Number of children: N/A  . Years of education: N/A   Occupational History  . Not on file.   Social History Main Topics  . Smoking status: Light Tobacco Smoker  . Smokeless tobacco: Never Used  . Alcohol use No  . Drug use: No   . Sexual activity: Not on file   Other Topics Concern  . Not on file   Social History Narrative   Lives alone in a one story home.  No children.     Retired.  Did work delivering copy machines.     Education: some college.    Review of Systems  Constitutional: Positive for fatigue and unexpected weight change. Negative for appetite change.  Respiratory: Negative for cough.   Gastrointestinal: Positive for abdominal distention, abdominal pain and constipation. Negative for blood in stool, nausea and vomiting.  Genitourinary: Negative for difficulty urinating, dysuria, frequency, hematuria and urgency.   Objective:  Physical Exam  Constitutional: He appears well-developed and well-nourished. No distress.  Thin/scaphoid appearence  HENT:  Head: Normocephalic and atraumatic.  Eyes: Conjunctivae are normal.  Neck: Neck supple.  Cardiovascular: Normal rate.   Pulmonary/Chest: Effort normal.  Abdominal:  Tender along the upper subic symphysis Well healed scar over the LLQ No appreciable hernia on exam No focal tenderness on abdominal exam No hernia appreciated Abdomen scaphoid in appearance  Neurological: He is alert.  Skin: Skin is warm and dry.  Psychiatric: He has a normal mood and affect. His behavior is normal.  Nursing note and vitals reviewed.  BP 130/78 (BP Location: Left Arm, Patient Position: Sitting, Cuff Size: Normal)   Pulse (!) 102   Temp 98.1 F (36.7 C) (Oral)   Resp 16   Ht 5' 10.5" (1.791 m)   Wt 129 lb 6.4 oz (58.7 kg)   SpO2 99%   BMI 18.30 kg/m  Assessment & Plan:    Frederick Fitzgerald is a 81 y.o. male Abdominal pain, generalized, Abdominal bloating - Plan: COMPLETE METABOLIC PANEL WITH GFR, CT Abdomen Pelvis W Contrast Bilateral renal cysts - Plan: CT Abdomen Pelvis W Contrast Weight loss - Plan: CBC, COMPLETE METABOLIC PANEL WITH GFR, TSH, CT Abdomen Pelvis W Contrast  - increased bloating with weight loss. Hx of renal cysts.   -check CMP, CT  abdomen/pelvis, then consider repeat GI or general surgery eval.   - trial of colace +/- miralax if needed for constipation and handout given.   Increased prostate specific antigen (PSA) velocity - Plan: PSA  - repeat PSA.   Hereditary peripheral neuropathy - Plan: Vitamin B12, TSH, gabapentin (NEURONTIN) 100 MG capsule  - increased sx's. Repeat vit B12, and tsh with wt loss above. Trial of low dose neurontin.   Follow up TBD on imaging/labs above.   Meds ordered this encounter  Medications  . Cyanocobalamin (VITAMIN B 12 PO)    Sig: Take 1,000 mg by mouth daily.  . Probiotic Product (PROBIOTIC PO)    Sig: Take by mouth daily.  Marland Kitchen gabapentin (NEURONTIN) 100 MG capsule    Sig: Take 1 capsule (100 mg total) by mouth 3 (three) times  daily. As needed. Can start 1 po qhs initially.    Dispense:  90 capsule    Refill:  1   Patient Instructions   Colace or other stool softener for constipation, then miralax if not able to have bowel movement in 2-3 days.  Drink plenty of fluids and fiber in diet.  I would like to obtain a ct scan of your abdomen to see if there are causes for your pain and weight loss, and to look at areas of prior hernia surgery. We will schedule this and call you with the time. I will also check prostate test as that level had increased some last time., As well as other blood tests including thyroid and vitamin B12. For the burning in your feet, you can try gabapentin 1 pill at night to begin with, then can be increased always up to 3 times per day. Follow-up with me in 2 weeks to discuss results from testing and to determine next step. Return to the clinic or go to the nearest emergency room if any of your symptoms worsen or new symptoms occur.   Abdominal Pain, Adult Abdominal pain can be caused by many things. Often, abdominal pain is not serious and it gets better with no treatment or by being treated at home. However, sometimes abdominal pain is serious. Your health  care provider will do a medical history and a physical exam to try to determine the cause of your abdominal pain. Follow these instructions at home:  Take over-the-counter and prescription medicines only as told by your health care provider. Do not take a laxative unless told by your health care provider.  Drink enough fluid to keep your urine clear or pale yellow.  Watch your condition for any changes.  Keep all follow-up visits as told by your health care provider. This is important. Contact a health care provider if:  Your abdominal pain changes or gets worse.  You are not hungry or you lose weight without trying.  You are constipated or have diarrhea for more than 2-3 days.  You have pain when you urinate or have a bowel movement.  Your abdominal pain wakes you up at night.  Your pain gets worse with meals, after eating, or with certain foods.  You are throwing up and cannot keep anything down.  You have a fever. Get help right away if:  Your pain does not go away as soon as your health care provider told you to expect.  You cannot stop throwing up.  Your pain is only in areas of the abdomen, such as the right side or the left lower portion of the abdomen.  You have bloody or black stools, or stools that look like tar.  You have severe pain, cramping, or bloating in your abdomen.  You have signs of dehydration, such as:  Dark urine, very little urine, or no urine.  Cracked lips.  Dry mouth.  Sunken eyes.  Sleepiness.  Weakness. This information is not intended to replace advice given to you by your health care provider. Make sure you discuss any questions you have with your health care provider. Document Released: 06/13/2005 Document Revised: 03/23/2016 Document Reviewed: 02/15/2016 Elsevier Interactive Patient Education  2017 [employer].   Constipation, Adult Constipation is when a person has fewer bowel movements in a week than normal, has difficulty  having a bowel movement, or has stools that are dry, hard, or larger than normal. Constipation may be caused by an underlying condition. It may  become worse with age if a person takes certain medicines and does not take in enough fluids. Follow these instructions at home: Eating and drinking  Eat foods that have a lot of fiber, such as fresh fruits and vegetables, whole grains, and beans.  Limit foods that are high in fat, low in fiber, or overly processed, such as french fries, hamburgers, cookies, candies, and soda.  Drink enough fluid to keep your urine clear or pale yellow. General instructions  Exercise regularly or as told by your health care provider.  Go to the restroom when you have the urge to go. Do not hold it in.  Take over-the-counter and prescription medicines only as told by your health care provider. These include any fiber supplements.  Practice pelvic floor retraining exercises, such as deep breathing while relaxing the lower abdomen and pelvic floor relaxation during bowel movements.  Watch your condition for any changes.  Keep all follow-up visits as told by your health care provider. This is important. Contact a health care provider if:  You have pain that gets worse.  You have a fever.  You do not have a bowel movement after 4 days.  You vomit.  You are not hungry.  You lose weight.  You are bleeding from the anus.  You have thin, pencil-like stools. Get help right away if:  You have a fever and your symptoms suddenly get worse.  You leak stool or have blood in your stool.  Your abdomen is bloated.  You have severe pain in your abdomen.  You feel dizzy or you faint. This information is not intended to replace advice given to you by your health care provider. Make sure you discuss any questions you have with your health care provider. Document Released: 06/01/2004 Document Revised: 03/23/2016 Document Reviewed: 02/22/2016 Elsevier Interactive  Patient Education  2017 [employer]  Peripheral Neuropathy Peripheral neuropathy is a type of nerve damage. It affects nerves that carry signals between the spinal cord and other parts of the body. These are called peripheral nerves. With peripheral neuropathy, one nerve or a group of nerves may be damaged. What are the causes? Many things can damage peripheral nerves. For some people with peripheral neuropathy, the cause is unknown. Some causes include:  Diabetes. This is the most common cause of peripheral neuropathy.  Injury to a nerve.  Pressure or stress on a nerve that lasts a long time.  Too little vitamin B. Alcoholism can lead to this.  Infections.  Autoimmune diseases, such as multiple sclerosis and systemic lupus erythematosus.  Inherited nerve diseases.  Some medicines, such as cancer drugs.  Toxic substances, such as lead and mercury.  Too little blood flowing to the legs.  Kidney disease.  Thyroid disease. What are the signs or symptoms? Different people have different symptoms. The symptoms you have will depend on which of your nerves is damaged. Common symptoms include:  Loss of feeling (numbness) in the feet and hands.  Tingling in the feet and hands.  Pain that burns.  Very sensitive skin.  Weakness.  Not being able to move a part of the body (paralysis).  Muscle twitching.  Clumsiness or poor coordination.  Loss of balance.  Not being able to control your bladder.  Feeling dizzy.  Sexual problems. How is this diagnosed? Peripheral neuropathy is a symptom, not a disease. Finding the cause of peripheral neuropathy can be hard. To figure that out, your health care provider will take a medical history and do a  physical exam. A neurological exam will also be done. This involves checking things affected by your brain, spinal cord, and nerves (nervous system). For example, your health care provider will check your reflexes, how you move, and  what you can feel. Other types of tests may also be ordered, such as:  Blood tests.  A test of the fluid in your spinal cord.  Imaging tests, such as CT scans or an MRI.  Electromyography (EMG). This test checks the nerves that control muscles.  Nerve conduction velocity tests. These tests check how fast messages pass through your nerves.  Nerve biopsy. A small piece of nerve is removed. It is then checked under a microscope. How is this treated?  Medicine is often used to treat peripheral neuropathy. Medicines may include:  Pain-relieving medicines. Prescription or over-the-counter medicine may be suggested.  Antiseizure medicine. This may be used for pain.  Antidepressants. These also may help ease pain from neuropathy.  Lidocaine. This is a numbing medicine. You might wear a patch or be given a shot.  Mexiletine. This medicine is typically used to help control irregular heart rhythms.  Surgery. Surgery may be needed to relieve pressure on a nerve or to destroy a nerve that is causing pain.  Physical therapy to help movement.  Assistive devices to help movement. Follow these instructions at home:  Only take over-the-counter or prescription medicines as directed by your health care provider. Follow the instructions carefully for any given medicines. Do not take any other medicines without first getting approval from your health care provider.  If you have diabetes, work closely with your health care provider to keep your blood sugar under control.  If you have numbness in your feet:  Check every day for signs of injury or infection. Watch for redness, warmth, and swelling.  Wear padded socks and comfortable shoes. These help protect your feet.  Do not do things that put pressure on your damaged nerve.  Do not smoke. Smoking keeps blood from getting to damaged nerves.  Avoid or limit alcohol. Too much alcohol can cause a lack of B vitamins. These vitamins are needed  for healthy nerves.  Develop a good support system. Coping with peripheral neuropathy can be stressful. Talk to a mental health specialist or join a support group if you are struggling.  Follow up with your health care provider as directed. Contact a health care provider if:  You have new signs or symptoms of peripheral neuropathy.  You are struggling emotionally from dealing with peripheral neuropathy.  You have a fever. Get help right away if:  You have an injury or infection that is not healing.  You feel very dizzy or begin vomiting.  You have chest pain.  You have trouble breathing. This information is not intended to replace advice given to you by your health care provider. Make sure you discuss any questions you have with your health care provider. Document Released: 08/24/2002 Document Revised: 02/09/2016 Document Reviewed: 05/11/2013 Elsevier Interactive Patient Education  2017 [employer].    IF you received an x-Fitzgerald today, you will receive an invoice from [facility]. Please contact [facility] at [phone] with questions or concerns regarding your invoice.   IF you received labwork today, you will receive an invoice from [employer]. Please contact [facility] at [phone] with questions or concerns regarding your invoice.   Our billing staff will not be able to assist you with questions regarding bills from these companies.  You will  be contacted with the lab results as soon as they are available. The fastest way to get your results is to activate your My Chart account. Instructions are located on the last page of this paperwork. If you have not heard from [place] regarding the results in 2 weeks, please contact this office.       I personally performed the services described in this documentation, which was scribed in my presence. The recorded information has been reviewed and considered, and addended by me as needed.    Signed,   Frederick Ray, MD [facility].  08/06/16 10:03 PM

## 2016-08-02 NOTE — Patient Instructions (Addendum)
Colace or other stool softener for constipation, then miralax if not able to have bowel movement in 2-3 days.  Drink plenty of fluids and fiber in diet.  I would like to obtain a ct scan of your abdomen to see if there are causes for your pain and weight loss, and to look at areas of prior hernia surgery. We will schedule this and call you with the time. I will also check prostate test as that level had increased some last time., As well as other blood tests including thyroid and vitamin B12. For the burning in your feet, you can try gabapentin 1 pill at night to begin with, then can be increased always up to 3 times per day. Follow-up with me in 2 weeks to discuss results from testing and to determine next step. Return to the clinic or go to the nearest emergency room if any of your symptoms worsen or new symptoms occur.   Abdominal Pain, Adult Abdominal pain can be caused by many things. Often, abdominal pain is not serious and it gets better with no treatment or by being treated at home. However, sometimes abdominal pain is serious. Your health care provider will do a medical history and a physical exam to try to determine the cause of your abdominal pain. Follow these instructions at home:  Take over-the-counter and prescription medicines only as told by your health care provider. Do not take a laxative unless told by your health care provider.  Drink enough fluid to keep your urine clear or pale yellow.  Watch your condition for any changes.  Keep all follow-up visits as told by your health care provider. This is important. Contact a health care provider if:  Your abdominal pain changes or gets worse.  You are not hungry or you lose weight without trying.  You are constipated or have diarrhea for more than 2-3 days.  You have pain when you urinate or have a bowel movement.  Your abdominal pain wakes you up at night.  Your pain gets worse with meals, after eating, or with certain  foods.  You are throwing up and cannot keep anything down.  You have a fever. Get help right away if:  Your pain does not go away as soon as your health care provider told you to expect.  You cannot stop throwing up.  Your pain is only in areas of the abdomen, such as the right side or the left lower portion of the abdomen.  You have bloody or black stools, or stools that look like tar.  You have severe pain, cramping, or bloating in your abdomen.  You have signs of dehydration, such as:  Dark urine, very little urine, or no urine.  Cracked lips.  Dry mouth.  Sunken eyes.  Sleepiness.  Weakness. This information is not intended to replace advice given to you by your health care provider. Make sure you discuss any questions you have with your health care provider. Document Released: 06/13/2005 Document Revised: 03/23/2016 Document Reviewed: 02/15/2016 Elsevier Interactive Patient Education  2017 [employer].   Constipation, Adult Constipation is when a person has fewer bowel movements in a week than normal, has difficulty having a bowel movement, or has stools that are dry, hard, or larger than normal. Constipation may be caused by an underlying condition. It may become worse with age if a person takes certain medicines and does not take in enough fluids. Follow these instructions at home: Eating and drinking  Eat foods that  have a lot of fiber, such as fresh fruits and vegetables, whole grains, and beans.  Limit foods that are high in fat, low in fiber, or overly processed, such as french fries, hamburgers, cookies, candies, and soda.  Drink enough fluid to keep your urine clear or pale yellow. General instructions  Exercise regularly or as told by your health care provider.  Go to the restroom when you have the urge to go. Do not hold it in.  Take over-the-counter and prescription medicines only as told by your health care provider. These include any fiber  supplements.  Practice pelvic floor retraining exercises, such as deep breathing while relaxing the lower abdomen and pelvic floor relaxation during bowel movements.  Watch your condition for any changes.  Keep all follow-up visits as told by your health care provider. This is important. Contact a health care provider if:  You have pain that gets worse.  You have a fever.  You do not have a bowel movement after 4 days.  You vomit.  You are not hungry.  You lose weight.  You are bleeding from the anus.  You have thin, pencil-like stools. Get help right away if:  You have a fever and your symptoms suddenly get worse.  You leak stool or have blood in your stool.  Your abdomen is bloated.  You have severe pain in your abdomen.  You feel dizzy or you faint. This information is not intended to replace advice given to you by your health care provider. Make sure you discuss any questions you have with your health care provider. Document Released: 06/01/2004 Document Revised: 03/23/2016 Document Reviewed: 02/22/2016 Elsevier Interactive Patient Education  2017 [employer]  Peripheral Neuropathy Peripheral neuropathy is a type of nerve damage. It affects nerves that carry signals between the spinal cord and other parts of the body. These are called peripheral nerves. With peripheral neuropathy, one nerve or a group of nerves may be damaged. What are the causes? Many things can damage peripheral nerves. For some people with peripheral neuropathy, the cause is unknown. Some causes include:  Diabetes. This is the most common cause of peripheral neuropathy.  Injury to a nerve.  Pressure or stress on a nerve that lasts a long time.  Too little vitamin B. Alcoholism can lead to this.  Infections.  Autoimmune diseases, such as multiple sclerosis and systemic lupus erythematosus.  Inherited nerve diseases.  Some medicines, such as cancer drugs.  Toxic substances, such as  lead and mercury.  Too little blood flowing to the legs.  Kidney disease.  Thyroid disease. What are the signs or symptoms? Different people have different symptoms. The symptoms you have will depend on which of your nerves is damaged. Common symptoms include:  Loss of feeling (numbness) in the feet and hands.  Tingling in the feet and hands.  Pain that burns.  Very sensitive skin.  Weakness.  Not being able to move a part of the body (paralysis).  Muscle twitching.  Clumsiness or poor coordination.  Loss of balance.  Not being able to control your bladder.  Feeling dizzy.  Sexual problems. How is this diagnosed? Peripheral neuropathy is a symptom, not a disease. Finding the cause of peripheral neuropathy can be hard. To figure that out, your health care provider will take a medical history and do a physical exam. A neurological exam will also be done. This involves checking things affected by your brain, spinal cord, and nerves (nervous system). For example, your health care  provider will check your reflexes, how you move, and what you can feel. Other types of tests may also be ordered, such as:  Blood tests.  A test of the fluid in your spinal cord.  Imaging tests, such as CT scans or an MRI.  Electromyography (EMG). This test checks the nerves that control muscles.  Nerve conduction velocity tests. These tests check how fast messages pass through your nerves.  Nerve biopsy. A small piece of nerve is removed. It is then checked under a microscope. How is this treated?  Medicine is often used to treat peripheral neuropathy. Medicines may include:  Pain-relieving medicines. Prescription or over-the-counter medicine may be suggested.  Antiseizure medicine. This may be used for pain.  Antidepressants. These also may help ease pain from neuropathy.  Lidocaine. This is a numbing medicine. You might wear a patch or be given a shot.  Mexiletine. This medicine is  typically used to help control irregular heart rhythms.  Surgery. Surgery may be needed to relieve pressure on a nerve or to destroy a nerve that is causing pain.  Physical therapy to help movement.  Assistive devices to help movement. Follow these instructions at home:  Only take over-the-counter or prescription medicines as directed by your health care provider. Follow the instructions carefully for any given medicines. Do not take any other medicines without first getting approval from your health care provider.  If you have diabetes, work closely with your health care provider to keep your blood sugar under control.  If you have numbness in your feet:  Check every day for signs of injury or infection. Watch for redness, warmth, and swelling.  Wear padded socks and comfortable shoes. These help protect your feet.  Do not do things that put pressure on your damaged nerve.  Do not smoke. Smoking keeps blood from getting to damaged nerves.  Avoid or limit alcohol. Too much alcohol can cause a lack of B vitamins. These vitamins are needed for healthy nerves.  Develop a good support system. Coping with peripheral neuropathy can be stressful. Talk to a mental health specialist or join a support group if you are struggling.  Follow up with your health care provider as directed. Contact a health care provider if:  You have new signs or symptoms of peripheral neuropathy.  You are struggling emotionally from dealing with peripheral neuropathy.  You have a fever. Get help right away if:  You have an injury or infection that is not healing.  You feel very dizzy or begin vomiting.  You have chest pain.  You have trouble breathing. This information is not intended to replace advice given to you by your health care provider. Make sure you discuss any questions you have with your health care provider. Document Released: 08/24/2002 Document Revised: 02/09/2016 Document Reviewed:  05/11/2013 Elsevier Interactive Patient Education  2017 [employer].    IF you received an x-ray today, you will receive an invoice from [facility]. Please contact [facility] at [phone] with questions or concerns regarding your invoice.   IF you received labwork today, you will receive an invoice from [employer]. Please contact [facility] at [phone] with questions or concerns regarding your invoice.   Our billing staff will not be able to assist you with questions regarding bills from these companies.  You will be contacted with the lab results as soon as they are available. The fastest way to get your results is to activate your My Chart account. Instructions are  located on the last page of this paperwork. If you have not heard from [place] regarding the results in 2 weeks, please contact this office.

## 2016-08-03 LAB — PSA: PSA: 2.5 ng/mL (ref <=4.0)

## 2016-08-03 LAB — VITAMIN B12: Vitamin B-12: 655 pg/mL (ref 200–1100)

## 2016-08-14 ENCOUNTER — Ambulatory Visit (INDEPENDENT_AMBULATORY_CARE_PROVIDER_SITE_OTHER): Payer: Medicare HMO | Admitting: Ophthalmology

## 2016-08-14 DIAGNOSIS — H353134 Nonexudative age-related macular degeneration, bilateral, advanced atrophic with subfoveal involvement: Secondary | ICD-10-CM

## 2016-08-14 DIAGNOSIS — H43813 Vitreous degeneration, bilateral: Secondary | ICD-10-CM

## 2016-08-14 DIAGNOSIS — H2512 Age-related nuclear cataract, left eye: Secondary | ICD-10-CM | POA: Diagnosis not present

## 2016-08-14 DIAGNOSIS — H53002 Unspecified amblyopia, left eye: Secondary | ICD-10-CM

## 2016-08-16 ENCOUNTER — Encounter: Payer: Self-pay | Admitting: Family Medicine

## 2016-08-16 ENCOUNTER — Ambulatory Visit
Admission: RE | Admit: 2016-08-16 | Discharge: 2016-08-16 | Disposition: A | Discharge status: Home / Self Care | Payer: Medicare HMO | Source: Ambulatory Visit | Attending: Family Medicine | Admitting: Family Medicine

## 2016-08-16 ENCOUNTER — Ambulatory Visit (INDEPENDENT_AMBULATORY_CARE_PROVIDER_SITE_OTHER): Payer: Medicare HMO | Admitting: Family Medicine

## 2016-08-16 VITALS — BP 138/90 | HR 84 | Temp 98.1°F | Resp 16 | Ht 70.5 in | Wt 128.4 lb

## 2016-08-16 DIAGNOSIS — K869 Disease of pancreas, unspecified: Secondary | ICD-10-CM

## 2016-08-16 DIAGNOSIS — K8689 Other specified diseases of pancreas: Secondary | ICD-10-CM | POA: Diagnosis not present

## 2016-08-16 DIAGNOSIS — R634 Abnormal weight loss: Secondary | ICD-10-CM

## 2016-08-16 DIAGNOSIS — R1084 Generalized abdominal pain: Secondary | ICD-10-CM

## 2016-08-16 DIAGNOSIS — N281 Cyst of kidney, acquired: Secondary | ICD-10-CM

## 2016-08-16 DIAGNOSIS — R109 Unspecified abdominal pain: Secondary | ICD-10-CM | POA: Diagnosis not present

## 2016-08-16 DIAGNOSIS — R140 Abdominal distension (gaseous): Secondary | ICD-10-CM

## 2016-08-16 DIAGNOSIS — N289 Disorder of kidney and ureter, unspecified: Secondary | ICD-10-CM | POA: Diagnosis not present

## 2016-08-16 DIAGNOSIS — G629 Polyneuropathy, unspecified: Secondary | ICD-10-CM | POA: Diagnosis not present

## 2016-08-16 MED ORDER — IOPAMIDOL (ISOVUE-300) INJECTION 61%
100.0000 mL | Freq: Once | INTRAVENOUS | Status: AC | PRN
  Administered 2016-08-16: 100 mL via INTRAVENOUS

## 2016-08-16 NOTE — Patient Instructions (Addendum)
I will order the MRI of your abdomen. I will refer you to Oncologist to discuss the CT scan and pancreas concern further and to decide on next step.   If you are having more difficulty eating or drinking - we may need to look at admitting you to the hospital for nutrition.  If pain worsening - let me know as medications can be prescribed or hospital evaluation if needed. Return to the clinic or go to the nearest emergency room if any of your symptoms worsen or new symptoms occur.    For the nerve pain in legs, you can try up to 3 pills at bedtime to see if that helps. If new side effects - can stop that medication.   IF you received an x-ray today, you will receive an invoice from [facility]. Please contact [facility]iology at [phone] with questions or concerns regarding your invoice.   IF you received labwork today, you will receive an invoice from [employer]. Please contact [facility] at [phone] with questions or concerns regarding your invoice.   Our billing staff will not be able to assist you with questions regarding bills from these companies.  You will be contacted with the lab results as soon as they are available. The fastest way to get your results is to activate your My Chart account. Instructions are located on the last page of this paperwork. If you have not heard from [place] regarding the results in 2 weeks, please contact this office.

## 2016-08-16 NOTE — Progress Notes (Addendum)
By signing my name below, I, [name], attest that this documentation has been prepared under the direction and in the presence of [name], MD.  Electronically Signed: [name], Medical Scribe. 08/16/16. 5:51 PM.  Subjective:    Patient ID: Frederick Fitzgerald, male    DOB: 01-May-1935, 81 y.o.   MRN: [id number]  HPI Chief Complaint  Patient presents with  . Follow-up    x n2 wks, pt states "nothing has changed, he is about the same"    HPI Comments: Frederick Fitzgerald is a 81 y.o. male who presents to the [facility] for 2 week follow-up on abdominal pain and weight loss. He was seen 11/16 with a 3-4 month hx of intermittent abdomianl bloating, fatigue, and unexplained weight loss with weight 150lbs 02/08/16 down to 129lbs on 11/16. He does have a hx of peripheral neuropathy that was treated with neurotin. Primarily here for abdominal pain and CT that was ordered. He had a nl: TSH, B12, PSA, CMP, and CBC. Last visit with borderline elevated glucose of 102. He had a CT of abdomen and pelvis today and I was called for the report from the radiologist. There was a pancreatic mass 2.8 cm in the mid pancreatic body; suspicious for malignancy. There was a possible mass effect on the SMP and splenic vein, there was also a new lesion on the lower pole of the right kidney measuring 1.3 cm, and bilateral renal cyst as well as upper pole right kidney lesion measuring 1.8 cm.  Pt reports experiencing constipation and feeling hungry, but if he eats half as much, he hurt half as much. Pt eats a "couple of  handfulls" of food a day and notes he's had the same diet for the last 45 years. Sates he feels fine in the morning, but towards the afternoon he feels worse. He has been taking neurontin 100mg  QHS and does not feel any difference. He does not feel comfortable with the listed side effects. States he's been up since 4am; he sleeps for 4 hours and gets up for 4 hours and sleep  again.  Denis nausea, emesis, and difficulty sleeping.  He's never had any trouble with taking a MRI, he does not have a pace maker or any metal material in his body. Wt Readings from Last 3 Encounters:  08/16/16 128 lb 6.4 oz (58.2 kg)  08/02/16 129 lb 6.4 oz (58.7 kg)  02/08/16 150 lb (68 kg)    Patient Active Problem List   Diagnosis Date Noted  . Hereditary and idiopathic peripheral neuropathy 12/23/2015   Past Medical History:  Diagnosis Date  . GERD (gastroesophageal reflux disease)   . Neuropathy [facility])    Past Surgical History:  Procedure Laterality Date  . HERNIA REPAIR     No Known Allergies Prior to Admission medications   Medication Sig Start Date End Date Taking? Authorizing Provider  Cyanocobalamin (VITAMIN B 12 PO) Take 1,000 mg by mouth daily.   Yes Historical Provider, MD  gabapentin (NEURONTIN) 100 MG capsule Take 1 capsule (100 mg total) by mouth 3 (three) times daily. As needed. Can start 1 po qhs initially. 08/02/16  Yes Wendie Agreste, MD  Probiotic Product (PROBIOTIC PO) Take by mouth daily.    Historical Provider, MD   Social History   Social History  . Marital status: Single    Spouse name: N/A  . Number of children: N/A  . Years of education: N/A   Occupational History  .  Not on file.   Social History Main Topics  . Smoking status: Light Tobacco Smoker  . Smokeless tobacco: Never Used  . Alcohol use No  . Drug use: No  . Sexual activity: Not on file   Other Topics Concern  . Not on file   Social History Narrative   Lives alone in a one story home.  No children.     Retired.  Did work delivering copy machines.     Education: some college.    Review of Systems  Constitutional: Negative for appetite change.  Gastrointestinal: Positive for abdominal distention and abdominal pain. Negative for nausea and vomiting.  Psychiatric/Behavioral: Negative for sleep disturbance.   Objective:  Physical Exam  Constitutional: He appears  well-developed and well-nourished. No distress.  HENT:  Head: Normocephalic and atraumatic.  Eyes: Conjunctivae are normal.  Neck: Neck supple.  Cardiovascular: Normal rate, regular rhythm and normal heart sounds.  Exam reveals no gallop and no friction rub.   No murmur heard. Pulmonary/Chest: Effort normal and breath sounds normal. No respiratory distress. He has no wheezes. He has no rales.  Abdominal: There is no tenderness (focal).  Abdomen is scaphoid  Neurological: He is alert.  Skin: Skin is warm and dry.  Psychiatric: He has a normal mood and affect. His behavior is normal.  Nursing note and vitals reviewed.  BP 138/90 (BP Location: Left Arm, Cuff Size: Normal)   Pulse 84   Temp 98.1 F (36.7 C) (Oral)   Resp 16   Ht 5' 10.5" (1.791 m)   Wt 128 lb 6.4 oz (58.2 kg)   SpO2 98%   BMI 18.16 kg/m  Assessment & Plan:   Frederick Fitzgerald is a 81 y.o. male Pancreatic mass - Plan: MR Abdomen W Wo Contrast, Ambulatory referral to Oncology Renal lesion - Plan: MR Abdomen W Wo Contrast, Ambulatory referral to Oncology Loss of weight - Plan: MR Abdomen W Wo Contrast, Ambulatory referral to Oncology Abdominal pain, unspecified abdominal location - Plan: MR Abdomen W Wo Contrast, Ambulatory referral to Oncology  -CT results suspicious for pancreatic cancer/mass. Will obtain MRI of abdomen, but also refer to oncology to discuss results and further workup.  -ER/RTC precautions discussed if difficulty eating as may need nutrition consult or parenteral nutrition.  Peripheral polyneuropathy ([place])  - Options discussed for dosing of Neurontin.  No orders of the defined types were placed in this encounter.  Patient Instructions   I will order the MRI of your abdomen. I will refer you to Oncologist to discuss the CT scan and pancreas concern further and to decide on next step.   If you are having more difficulty eating or drinking - we may need to look at admitting you to the hospital for  nutrition.  If pain worsening - let me know as medications can be prescribed or hospital evaluation if needed. Return to the clinic or go to the nearest emergency room if any of your symptoms worsen or new symptoms occur.    For the nerve pain in legs, you can try up to 3 pills at bedtime to see if that helps. If new side effects - can stop that medication.   IF you received an x-ray today, you will receive an invoice from [facility]. Please contact [facility] Radiology at [phone] with questions or concerns regarding your invoice.   IF you received labwork today, you will receive an invoice from [employer]. Please contact [facility] at [phone] with questions or  concerns regarding your invoice.   Our billing staff will not be able to assist you with questions regarding bills from these companies.  You will be contacted with the lab results as soon as they are available. The fastest way to get your results is to activate your My Chart account. Instructions are located on the last page of this paperwork. If you have not heard from [place] regarding the results in 2 weeks, please contact this office.        I personally performed the services described in this documentation, which was scribed in my presence. The recorded information has been reviewed and considered, and addended by me as needed.   Signed,   [name], MD [facility].  08/16/16 7:06 PM

## 2016-08-20 ENCOUNTER — Encounter: Payer: Self-pay | Admitting: Family Medicine

## 2016-08-24 ENCOUNTER — Telehealth: Payer: Self-pay | Admitting: Emergency Medicine

## 2016-08-24 NOTE — Telephone Encounter (Signed)
Clinical documentation provided to Geni Bers, RN from [place] coordinator for scheduled MRI 08/24/2016.  Approval code [id number] and given to [place] @ [place].

## 2016-08-26 ENCOUNTER — Ambulatory Visit
Admission: RE | Admit: 2016-08-26 | Discharge: 2016-08-26 | Disposition: A | Discharge status: Home / Self Care | Payer: Medicare HMO | Source: Ambulatory Visit | Attending: Family Medicine | Admitting: Family Medicine

## 2016-08-26 DIAGNOSIS — N289 Disorder of kidney and ureter, unspecified: Secondary | ICD-10-CM

## 2016-08-26 DIAGNOSIS — R634 Abnormal weight loss: Secondary | ICD-10-CM

## 2016-08-26 DIAGNOSIS — K8689 Other specified diseases of pancreas: Secondary | ICD-10-CM

## 2016-08-26 DIAGNOSIS — R109 Unspecified abdominal pain: Secondary | ICD-10-CM

## 2016-08-26 MED ORDER — GADOBENATE DIMEGLUMINE 529 MG/ML IV SOLN
12.0000 mL | Freq: Once | INTRAVENOUS | Status: AC | PRN
  Administered 2016-08-26: 12 mL via INTRAVENOUS

## 2016-08-27 ENCOUNTER — Encounter: Payer: Self-pay | Admitting: Family Medicine

## 2016-08-28 ENCOUNTER — Telehealth: Payer: Self-pay | Admitting: Emergency Medicine

## 2016-08-28 ENCOUNTER — Ambulatory Visit (INDEPENDENT_AMBULATORY_CARE_PROVIDER_SITE_OTHER): Payer: Medicare HMO

## 2016-08-28 ENCOUNTER — Ambulatory Visit (INDEPENDENT_AMBULATORY_CARE_PROVIDER_SITE_OTHER): Payer: Medicare HMO | Admitting: Family Medicine

## 2016-08-28 VITALS — BP 132/80 | HR 81 | Temp 97.7°F | Resp 16 | Ht 70.5 in | Wt 127.0 lb

## 2016-08-28 DIAGNOSIS — K869 Disease of pancreas, unspecified: Secondary | ICD-10-CM | POA: Diagnosis not present

## 2016-08-28 DIAGNOSIS — R109 Unspecified abdominal pain: Secondary | ICD-10-CM | POA: Diagnosis not present

## 2016-08-28 DIAGNOSIS — R1084 Generalized abdominal pain: Secondary | ICD-10-CM

## 2016-08-28 DIAGNOSIS — N2889 Other specified disorders of kidney and ureter: Secondary | ICD-10-CM

## 2016-08-28 DIAGNOSIS — K5900 Constipation, unspecified: Secondary | ICD-10-CM | POA: Diagnosis not present

## 2016-08-28 DIAGNOSIS — K8689 Other specified diseases of pancreas: Secondary | ICD-10-CM

## 2016-08-28 MED ORDER — TRAMADOL HCL 50 MG PO TABS: 50.0000 mg | ORAL_TABLET | Freq: Three times a day (TID) | ORAL | 0 refills | Status: DC | PRN

## 2016-08-28 NOTE — Telephone Encounter (Signed)
Pt advised to return to the clinic today @ 230pm per Dr. Carlota Raspberry.

## 2016-08-28 NOTE — Telephone Encounter (Signed)
Pt called in requesting MRI results. States, he is constipated and needs some relief. My chart message routed to Dr. Carlota Raspberry and f/u info given

## 2016-08-28 NOTE — Patient Instructions (Addendum)
Start MiraLAX for constipation. That can be taken up to twice per day until stools are softer and moving more normally. Make sure you're drinking plenty of fluids as that is another cause of constipation. If needed for abdominal pain, I did write tramadol to be taken up to every 6 hours. It appears they are still working on your referral to oncology for the kidney and pancreas masses, but if you have not heard anything by next Monday, let me know and I will be happy to look into that further. If you have any worsening pain or other worsening symptoms return here or emergency room.   Constipation, Adult Constipation is when a person has fewer bowel movements in a week than normal, has difficulty having a bowel movement, or has stools that are dry, hard, or larger than normal. Constipation may be caused by an underlying condition. It may become worse with age if a person takes certain medicines and does not take in enough fluids. Follow these instructions at home: Eating and drinking  Eat foods that have a lot of fiber, such as fresh fruits and vegetables, whole grains, and beans.  Limit foods that are high in fat, low in fiber, or overly processed, such as french fries, hamburgers, cookies, candies, and soda.  Drink enough fluid to keep your urine clear or pale yellow. General instructions  Exercise regularly or as told by your health care provider.  Go to the restroom when you have the urge to go. Do not hold it in.  Take over-the-counter and prescription medicines only as told by your health care provider. These include any fiber supplements.  Practice pelvic floor retraining exercises, such as deep breathing while relaxing the lower abdomen and pelvic floor relaxation during bowel movements.  Watch your condition for any changes.  Keep all follow-up visits as told by your health care provider. This is important. Contact a health care provider if:  You have pain that gets worse.  You  have a fever.  You do not have a bowel movement after 4 days.  You vomit.  You are not hungry.  You lose weight.  You are bleeding from the anus.  You have thin, pencil-like stools. Get help right away if:  You have a fever and your symptoms suddenly get worse.  You leak stool or have blood in your stool.  Your abdomen is bloated.  You have severe pain in your abdomen.  You feel dizzy or you faint. This information is not intended to replace advice given to you by your health care provider. Make sure you discuss any questions you have with your health care provider. Document Released: 06/01/2004 Document Revised: 03/23/2016 Document Reviewed: 02/22/2016 Elsevier Interactive Patient Education  2017 [employer].   IF you received an x-ray today, you will receive an invoice from [facility]. Please contact [facility]iology at [phone] with questions or concerns regarding your invoice.   IF you received labwork today, you will receive an invoice from [employer]. Please contact [facility] at [phone] with questions or concerns regarding your invoice.   Our billing staff will not be able to assist you with questions regarding bills from these companies.  You will be contacted with the lab results as soon as they are available. The fastest way to get your results is to activate your My Chart account. Instructions are located on the last page of this paperwork. If you have not heard from [place] regarding the results in 2 weeks,  please contact this office.

## 2016-08-28 NOTE — Progress Notes (Signed)
Subjective:  By signing my name below, I, Moises Fitzgerald, attest that this documentation has been prepared under the direction and in the presence of Frederick Ray, MD. Electronically Signed: Moises Fitzgerald, [place]. 08/28/2016 , 4:07 PM .  Patient was seen in Room 12 .   Patient ID: Frederick Fitzgerald, male    DOB: 05-15-1935, 81 y.o.   MRN: [id number] Chief Complaint  Patient presents with  . Follow-up    MRI results  . OTHER    pt would like to discuss issues he stated in mychart   HPI Frederick Fitzgerald is a 81 y.o. male  Here for follow up. See messages from this morning. He was last seen on Nov 30th; found to have a pancreatic mass on CT scan concerning pancreatic cancer. He was referred to oncology. He has had some long standing abdominal pain constipation. We had discussed colace +/- miralax for constipation in the past. He's been trying colace without benefit. Per his mychart, his last bowel movement was on Dec 7th, and reports it was really hard. He took 2 colace on 12/8 and 2 colace on 12/10 without relief. He hasn't taken miralax with the colace. He hasn't been eating as much because he has pain after eating. He would have pain within seconds if he eats sweets, and other foods pain in few hours. He's been treating some pain with episodic marijuana use, which has been effective.   He had MRI of abdomen done 2 days ago which showed 3.1x3.2x3.2 mass in the pancreatic body, highly suspicious for primary pancreatic neoplasm with atrophy of pancreatic tail with ductal dilatation. There was a 25mm posterior right kidney solid lesion with similar lesion measured in 2011, concern for solid renal neoplasm; no findings for metastatic disease. He had MRI of abdomen in 2013, which showed multiple bilateral renal cyst, but no evidence of renal neoplasm.   Patient Active Problem List   Diagnosis Date Noted  . Hereditary and idiopathic peripheral neuropathy 12/23/2015   Past Medical History:    Diagnosis Date  . GERD (gastroesophageal reflux disease)   . Neuropathy [facility])    Past Surgical History:  Procedure Laterality Date  . HERNIA REPAIR     No Known Allergies Prior to Admission medications   Medication Sig Start Date End Date Taking? Authorizing Provider  Cyanocobalamin (VITAMIN B 12 PO) Take 1,000 mg by mouth daily.   Yes Historical Provider, MD  gabapentin (NEURONTIN) 100 MG capsule Take 1 capsule (100 mg total) by mouth 3 (three) times daily. As needed. Can start 1 po qhs initially. Patient not taking: Reported on 08/28/2016 08/02/16   Wendie Agreste, MD  Probiotic Product (PROBIOTIC PO) Take by mouth daily.    Historical Provider, MD   Social History   Social History  . Marital status: Single    Spouse name: N/A  . Number of children: N/A  . Years of education: N/A   Occupational History  . Not on file.   Social History Main Topics  . Smoking status: Light Tobacco Smoker  . Smokeless tobacco: Never Used  . Alcohol use No  . Drug use: No  . Sexual activity: Not on file   Other Topics Concern  . Not on file   Social History Narrative   Lives alone in a one story home.  No children.     Retired.  Did work delivering copy machines.     Education: some college.    Review of Systems  Constitutional:  Negative for fatigue and unexpected weight change.  Eyes: Negative for visual disturbance.  Respiratory: Negative for cough, chest tightness and shortness of breath.   Cardiovascular: Negative for chest pain, palpitations and leg swelling.  Gastrointestinal: Positive for abdominal pain and constipation. Negative for Fitzgerald in stool.  Neurological: Negative for dizziness, light-headedness and headaches.       Objective:   Physical Exam  Constitutional: He is oriented to person, place, and time. He appears well-developed and well-nourished. No distress.  HENT:  Head: Normocephalic and atraumatic.  Eyes: EOM are normal. Pupils are equal, round, and  reactive to light.  Neck: Neck supple.  Cardiovascular: Normal rate.   Pulmonary/Chest: Effort normal. No respiratory distress.  Genitourinary:  Genitourinary Comments: Rectal: very small amount of hard stool in rectal vault, but minimal impaction  Musculoskeletal: Normal range of motion.  Neurological: He is alert and oriented to person, place, and time.  Skin: Skin is warm and dry.  Psychiatric: He has a normal mood and affect. His behavior is normal.  Nursing note and vitals reviewed.   Vitals:   08/28/16 1456  BP: 132/80  Pulse: 81  Resp: 16  Temp: 97.7 F (36.5 C)  TempSrc: Oral  SpO2: 100%  Weight: 127 lb (57.6 kg)  Height: 5' 10.5" (1.791 m)   Dg Abd 1 View  Result Date: 08/28/2016 CLINICAL DATA:  Abdominal pain. EXAM: ABDOMEN - 1 VIEW COMPARISON:  None. FINDINGS: There is a moderate volume stool in the ascending, transverse and descending colon moderate volume stool in the rectosigmoid colon. No dilated loops of small bowel. Degeneration and scoliosis of the spine. IMPRESSION: Moderate volume stool throughout colon suggests constipation. Electronically Signed   By: Suzy Bouchard M.D.   On: 08/28/2016 16:33       Assessment & Plan:    Frederick Fitzgerald is a 81 y.o. male Generalized abdominal pain - Plan: DG Abd 1 ViewConstipation, unspecified constipation type - Plan: DG Abd 1 View  -No fecal impaction appreciated. Trial of MiraLAX over-the-counter twice per day, may need Fleet enema if not improving within the next few days. Increase water intake. RTC precautions if persistent. May be cause of abdominal pain, so can try constipation treatment initially. Did provide pain medication as below if needed.  Pancreatic mass Right renal mass  - Both areas concerning for possible malignancy. Oncology referral is pending. Did discuss possible urology or nephrology evaluation for renal mass, but can discuss with oncology initial approach. As above, abdominal pain may be in  part due to constipation, but tramadol prescription given if needed for breakthrough pain. Cautioned on constipation with that medication as well.  Meds ordered this encounter  Medications  . traMADol (ULTRAM) 50 MG tablet    Sig: Take 1 tablet (50 mg total) by mouth every 8 (eight) hours as needed.    Dispense:  30 tablet    Refill:  0   Patient Instructions    Start MiraLAX for constipation. That can be taken up to twice per day until stools are softer and moving more normally. Make sure you're drinking plenty of fluids as that is another cause of constipation. If needed for abdominal pain, I did write tramadol to be taken up to every 6 hours. It appears they are still working on your referral to oncology for the kidney and pancreas masses, but if you have not heard anything by next Monday, let me know and I will be happy to look into that further. If you  have any worsening pain or other worsening symptoms return here or emergency room.   Constipation, Adult Constipation is when a person has fewer bowel movements in a week than normal, has difficulty having a bowel movement, or has stools that are dry, hard, or larger than normal. Constipation may be caused by an underlying condition. It may become worse with age if a person takes certain medicines and does not take in enough fluids. Follow these instructions at home: Eating and drinking  Eat foods that have a lot of fiber, such as fresh fruits and vegetables, whole grains, and beans.  Limit foods that are high in fat, low in fiber, or overly processed, such as french fries, hamburgers, cookies, candies, and soda.  Drink enough fluid to keep your urine clear or pale yellow. General instructions  Exercise regularly or as told by your health care provider.  Go to the restroom when you have the urge to go. Do not hold it in.  Take over-the-counter and prescription medicines only as told by your health care provider. These include any  fiber supplements.  Practice pelvic floor retraining exercises, such as deep breathing while relaxing the lower abdomen and pelvic floor relaxation during bowel movements.  Watch your condition for any changes.  Keep all follow-up visits as told by your health care provider. This is important. Contact a health care provider if:  You have pain that gets worse.  You have a fever.  You do not have a bowel movement after 4 days.  You vomit.  You are not hungry.  You lose weight.  You are bleeding from the anus.  You have thin, pencil-like stools. Get help right away if:  You have a fever and your symptoms suddenly get worse.  You leak stool or have Fitzgerald in your stool.  Your abdomen is bloated.  You have severe pain in your abdomen.  You feel dizzy or you faint. This information is not intended to replace advice given to you by your health care provider. Make sure you discuss any questions you have with your health care provider. Document Released: 06/01/2004 Document Revised: 03/23/2016 Document Reviewed: 02/22/2016 Elsevier Interactive Patient Education  2017 [employer].   IF you received an x-Fitzgerald today, you will receive an invoice from [facility]. Please contact [facility] at [phone] with questions or concerns regarding your invoice.   IF you received labwork today, you will receive an invoice from [employer]. Please contact [facility] at [phone] with questions or concerns regarding your invoice.   Our billing staff will not be able to assist you with questions regarding bills from these companies.  You will be contacted with the lab results as soon as they are available. The fastest way to get your results is to activate your My Chart account. Instructions are located on the last page of this paperwork. If you have not heard from [place] regarding the results in 2 weeks, please contact this office.        I  personally performed the services described in this documentation, which was scribed in my presence. The recorded information has been reviewed and considered, and addended by me as needed.   Signed,   Frederick Ray, MD [facility].  08/28/16 5:13 PM

## 2016-08-29 ENCOUNTER — Encounter: Payer: Self-pay | Admitting: Family Medicine

## 2016-08-29 DIAGNOSIS — N289 Disorder of kidney and ureter, unspecified: Secondary | ICD-10-CM | POA: Insufficient documentation

## 2016-08-29 NOTE — Telephone Encounter (Signed)
How does he stop going to the bathroom?  Does he take more miralax?   [phone]

## 2016-08-30 ENCOUNTER — Other Ambulatory Visit (HOSPITAL_BASED_OUTPATIENT_CLINIC_OR_DEPARTMENT_OTHER): Payer: Medicare HMO

## 2016-08-30 ENCOUNTER — Ambulatory Visit (HOSPITAL_BASED_OUTPATIENT_CLINIC_OR_DEPARTMENT_OTHER): Payer: Medicare HMO | Admitting: Hematology & Oncology

## 2016-08-30 ENCOUNTER — Ambulatory Visit: Payer: Medicare HMO

## 2016-08-30 VITALS — BP 145/82 | HR 86 | Temp 98.0°F | Resp 16 | Wt 127.0 lb

## 2016-08-30 DIAGNOSIS — R634 Abnormal weight loss: Secondary | ICD-10-CM

## 2016-08-30 DIAGNOSIS — K869 Disease of pancreas, unspecified: Secondary | ICD-10-CM

## 2016-08-30 DIAGNOSIS — K8689 Other specified diseases of pancreas: Secondary | ICD-10-CM

## 2016-08-30 DIAGNOSIS — N2889 Other specified disorders of kidney and ureter: Secondary | ICD-10-CM | POA: Diagnosis not present

## 2016-08-30 LAB — CBC WITH DIFFERENTIAL (CANCER CENTER ONLY)
BASO#: 0 10*3/uL (ref 0.0–0.2)
BASO%: 0.1 % (ref 0.0–2.0)
EOS%: 0.6 % (ref 0.0–7.0)
Eosinophils Absolute: 0 10*3/uL (ref 0.0–0.5)
HEMATOCRIT: 40.8 % (ref 38.7–49.9)
HGB: 13.7 g/dL (ref 13.0–17.1)
LYMPH#: 1.1 10*3/uL (ref 0.9–3.3)
LYMPH%: 16.7 % (ref 14.0–48.0)
MCH: 30.5 pg (ref 28.0–33.4)
MCHC: 33.6 g/dL (ref 32.0–35.9)
MCV: 91 fL (ref 82–98)
MONO#: 0.6 10*3/uL (ref 0.1–0.9)
MONO%: 8.8 % (ref 0.0–13.0)
NEUT#: 5 10*3/uL (ref 1.5–6.5)
NEUT%: 73.8 % (ref 40.0–80.0)
Platelets: 336 10*3/uL (ref 145–400)
RBC: 4.49 10*6/uL (ref 4.20–5.70)
RDW: 13.5 % (ref 11.1–15.7)
WBC: 6.7 10*3/uL (ref 4.0–10.0)

## 2016-08-30 LAB — CMP (CANCER CENTER ONLY)
ALT(SGPT): 19 U/L (ref 10–47)
AST: 29 U/L (ref 11–38)
Albumin: 3.8 g/dL (ref 3.3–5.5)
Alkaline Phosphatase: 41 U/L (ref 26–84)
BUN, Bld: 8 mg/dL (ref 7–22)
CALCIUM: 9.5 mg/dL (ref 8.0–10.3)
CHLORIDE: 100 meq/L (ref 98–108)
CO2: 27 meq/L (ref 18–33)
Creat: 0.6 mg/dl (ref 0.6–1.2)
GLUCOSE: 119 mg/dL — AB (ref 73–118)
POTASSIUM: 3.9 meq/L (ref 3.3–4.7)
Sodium: 138 mEq/L (ref 128–145)
Total Bilirubin: 1.1 mg/dl (ref 0.20–1.60)
Total Protein: 7 g/dL (ref 6.4–8.1)

## 2016-08-30 LAB — LACTATE DEHYDROGENASE: LDH: 177 U/L (ref 125–245)

## 2016-08-30 MED ORDER — LACTULOSE 20 GM/30ML PO SOLN: 30.0000 mL | ORAL | 2 refills | Status: DC | PRN

## 2016-08-30 NOTE — Telephone Encounter (Signed)
Spoke with pt yesterday afternoon.  He had taken 2 doses of Miralax and still no results.  States passing gas. Advised to drink more water preferrably warm, soups to help stimulate the digestive tract.  States he has taken only 1 Tramadol.  He will monitor the pain and if it seems to change or get more severe to call us or go to ED. Advised pt to call on Fri am and let us know his progress.

## 2016-08-30 NOTE — Telephone Encounter (Signed)
Patient stopped by to discuss these concerns at 104. Only took 1 pain pill several days ago (tramadol).  Took 2 doses miralax 2 days ago, 2 doses yesterday, 1 today and still no bowel movement.  Saw Dr. Marin Olp today and was prescribed lactulose to take today - 14ml every 4 hours. He wanted to make sure it was ok to take this medication.  As bowel movements soften, can switch to colace once per day. rtc precautions.

## 2016-08-30 NOTE — Progress Notes (Signed)
Referral MD  Reason for Referral: Pancreatic mass - body of pancreas Chief Complaint  Patient presents with  . New Evaluation  : I have been losing a lot of weight.  HPI: Frederick Fitzgerald is a very nice 81 year old white male. He is originally from [place]. He actually went to [employer] down in [place]. He was in the [employer].  He has been losing weight. His appetite is present but he just does not feel like eating much.  He's had no nausea or vomiting. He says he is not all that home because of bad constipation. He says he been constipated for several months. He has not yet seen a gastroenterologist.  I think he's tried some over-the-counter remedies. He's been on some MiraLAX. Of these been on some Colace.  He's had no frank abdominal pain. He's had no rashes.  He thinks he is probably has lost 25 pounds over the past couple months or so.  Ultimately, he had an MRI of the abdomen. This was done because of a CT scan that was done previously which showed a tumor in the pancreas. A CT scan done on 08/16/2016 showed a 2.8 cm mass in the mid pancreatic body. This was in close proximity of the mesenteric vein. He also had a lesion in the upper pole of the right kidney measuring 1.8 cm. Also noted in the lower pole of the right kidney is a 1.3 cm lesion.  The MRI which was done on 08/26/2016 showed a 3.1 x 3.2 x 3.2 cm mass in the pancreatic body. This was highly suspicious for a primary pancreatic neoplasm. There is some atrophy of the pancreatic tail. The mass abuts the SMV. The celiac axis and SMA are clean. The liver looked okay. There is no obvious adenopathy. The right kidney showed multiple cysts. There was a 1.5 cm posterior right lower kidney lesion is worrisome for a solid renal neoplasm.  He does not smoke cigarettes. He does smoke marijuana. He does not drink.  There is no history of malignancy in the family.  He has had past surgery for inguinal hernia  repair.  Outside of the weight loss, he actually has been doing pretty well.  Overall, I would say his performance status is ECOG 1.   Past Medical History:  Diagnosis Date  . GERD (gastroesophageal reflux disease)   . Neuropathy ([place])   :  Past Surgical History:  Procedure Laterality Date  . HERNIA REPAIR    :   Current Outpatient Prescriptions:  .  Cyanocobalamin (VITAMIN B 12 PO), Take 1,000 mg by mouth daily., Disp: , Rfl:  .  gabapentin (NEURONTIN) 100 MG capsule, Take 1 capsule (100 mg total) by mouth 3 (three) times daily. As needed. Can start 1 po qhs initially. (Patient not taking: Reported on 08/28/2016), Disp: 90 capsule, Rfl: 1 .  Lactulose 20 GM/30ML SOLN, Take 30 mLs (20 g total) by mouth every 4 (four) hours as needed., Disp: 1000 mL, Rfl: 2 .  Probiotic Product (PROBIOTIC PO), Take by mouth daily., Disp: , Rfl:  .  traMADol (ULTRAM) 50 MG tablet, Take 1 tablet (50 mg total) by mouth every 8 (eight) hours as needed., Disp: 30 tablet, Rfl: 0:  :  No Known Allergies:  Family History  Problem Relation Age of Onset  . Heart failure Mother     Deceased  . AAA (abdominal aortic aneurysm) Father     Deceased  . Heart disease Brother     Deceased,  58  :  Social History   Social History  . Marital status: Single    Spouse name: N/A  . Number of children: N/A  . Years of education: N/A   Occupational History  . Not on file.   Social History Main Topics  . Smoking status: Light Tobacco Smoker  . Smokeless tobacco: Never Used  . Alcohol use No  . Drug use: No  . Sexual activity: Not on file   Other Topics Concern  . Not on file   Social History Narrative   Lives alone in a one story home.  No children.     Retired.  Did work delivering copy machines.     Education: some college.   :  Pertinent items are noted in HPI.  Exam: @[facility]ALS @ Thin white male in no obvious distress. Vital signs show temperature of 98. Pulse 86. Blood pressure 145/82.  Weight is 127 pounds. Hent exam shows no ocular or oral lesions. He has no scleral icterus. He has no adenopathy in the neck. Thyroid is nonpalpable. Lungs are clear bilaterally. Cardiac exam regular rate and rhythm with an occasional extra beat. He has no murmurs, rubs or bruits. Abdomen is soft. He has no fluid wave. There is no obvious abdominal mass. He has no guarding or rebound tenderness. He has no palpable liver or spleen tip. Back exam shows no tenderness over the spine, ribs or hips. Extremities shows no clubbing, cyanosis or edema. He has good range of motion of his joints. No venous cord is noted in his legs. Skin exam shows no rashes, ecchymoses or petechia. Neurological exam shows no focal neurological deficits.    Recent Labs  08/30/16 1333  WBC 6.7  HGB 13.7  HCT 40.8  PLT 336    Recent Labs  08/30/16 1333  NA 138  K 3.9  CL 100  CO2 27  GLUCOSE 119*  BUN 8  CREATININE 0.6  CALCIUM 9.5    Blood smear review:  None  Pathology: None     Assessment and Plan:  Frederick Fitzgerald is a 81 year old white male. He has weight loss. He has a pancreatic body mass.  One has to worry that he has pancreatic cancer. I'm just surprised by the weight loss. I'm not sure what else could be causing the weight loss.  I really think that we should consider him for surgical resection. From my point of view, I think that the tumor might be considered resectable. I suppose that he might be considered to have borderline resectable disease with respect to the SMV. This is definitely a tough call.  I think one problem might be is that he is not the greatest candidate for chemotherapy. Again, not sure as to why the weight loss. He feels confident that this is from constipation. I'm unsure why he would have constipation. He is not on any kind of narcotics. I will see anything obvious on his scans or MRI that shows metastatic disease or disease involving the mesentery. I am sending off a CA 19-9 and  we will see what that shows. It is possible that he just may have disease that is not being picked up on our scans.  I taught him for about 60 minutes. He is very interesting. I will refer him to general surgery. We will see if they would feel confident or comfortable going for up front resection. If they don't, then we may have to consider neoadjuvant therapy.  It would be nice if  his weight was up a little bit more.  Again, he is very nice. I had a nice talk with him. He has a very good understanding of the situation.

## 2016-08-31 LAB — PREALBUMIN: PREALBUMIN: 17 mg/dL (ref 9–32)

## 2016-08-31 LAB — CANCER ANTIGEN 19-9

## 2016-09-06 ENCOUNTER — Other Ambulatory Visit: Payer: Self-pay | Admitting: *Deleted

## 2016-09-07 ENCOUNTER — Ambulatory Visit (HOSPITAL_BASED_OUTPATIENT_CLINIC_OR_DEPARTMENT_OTHER): Payer: Medicare HMO | Admitting: Hematology & Oncology

## 2016-09-07 ENCOUNTER — Other Ambulatory Visit: Payer: Medicare HMO

## 2016-09-07 VITALS — BP 144/63 | HR 92 | Temp 98.1°F | Resp 16 | Wt 128.0 lb

## 2016-09-07 DIAGNOSIS — K869 Disease of pancreas, unspecified: Secondary | ICD-10-CM | POA: Diagnosis not present

## 2016-09-07 DIAGNOSIS — K8689 Other specified diseases of pancreas: Secondary | ICD-10-CM

## 2016-09-14 NOTE — Progress Notes (Signed)
Hematology and Oncology Follow Up Visit  AUGUSTA PROPSON [id number] 09/03/1935 81 y.o. 09/14/2016   Principle Diagnosis:   Pancreatic mass with elevated CA 19-9  Current Therapy:    Observation     Interim History:  Mr. Bondy is back for follow-up. I first saw him on December 14. When I first saw him, I thought that he might be a surgical candidate as his scans only showed a mass in the pancreas. This mass only measured 2.8 cm. However, we got his CA-19-9 back, it was almost 2000. I think this is a very ominous factor and would probably explain why he's lost weight.  I thought we can get him over to surgery further opinion. However, when his CA 19-9 came back so elevated, in talking to the surgeon, we do not feel that he would benefit from upfront surgery. She thought that neoadjuvant therapy would help. However, we really need a biopsy.  I  got him back in to talk to him about a biopsy. I think an upper endoscopy with endoscopic ultrasound biopsy would be the way to go.  His appetite might be a little bit better. He's had no nausea or vomiting. He's had better bowel movements. When I saw him, he had a lot of constipation. I was worried that this might indicate peritoneal spread with involvement of his abdominal neurologic plexus. Thankfully, lactulose has helped this.  There has really not been much in way of pain.  Currently, his performance status is ECOG 2.  Medications:  Current Outpatient Prescriptions:  .  Cyanocobalamin (VITAMIN B 12 PO), Take 1,000 mg by mouth daily., Disp: , Rfl:  .  gabapentin (NEURONTIN) 100 MG capsule, Take 1 capsule (100 mg total) by mouth 3 (three) times daily. As needed. Can start 1 po qhs initially., Disp: 90 capsule, Rfl: 1 .  Lactulose 20 GM/30ML SOLN, Take 30 mLs (20 g total) by mouth every 4 (four) hours as needed., Disp: 1000 mL, Rfl: 2 .  Probiotic Product (PROBIOTIC PO), Take by mouth daily., Disp: , Rfl:  .  traMADol (ULTRAM) 50 MG  tablet, Take 1 tablet (50 mg total) by mouth every 8 (eight) hours as needed., Disp: 30 tablet, Rfl: 0  Allergies: No Known Allergies  Past Medical History, Surgical history, Social history, and Family History were reviewed and updated.  Review of Systems: HEENT exam shows no ocular or oral lesions. He has no scleral icterus. He has no adenopathy in the neck. Thyroid is nonpalpable. Lungs are clear bilaterally. Cardiac exam regular rate and rhythm with an occasional extra beat. He has no murmurs, rubs or bruits. Abdomen is soft. He has no fluid wave. There is no obvious abdominal mass. He has no guarding or rebound tenderness. He has no palpable liver or spleen tip. Back exam shows no tenderness over the spine, ribs or hips. Extremities shows no clubbing, cyanosis or edema. He has good range of motion of his joints. No venous cord is noted in his legs. Skin exam shows no rashes, ecchymoses or petechia. Neurological exam shows no focal neurological deficits.   Physical Exam:  weight is 128 lb (58.1 kg). His oral temperature is 98.1 F (36.7 C). His blood pressure is 144/63 (abnormal) and his pulse is 92. His respiration is 16 and oxygen saturation is 100%.   Wt Readings from Last 3 Encounters:  09/07/16 128 lb (58.1 kg)  08/30/16 127 lb (57.6 kg)  08/28/16 127 lb (57.6 kg)     As above  Lab Results  Component Value Date   WBC 6.7 08/30/2016   HGB 13.7 08/30/2016   HCT 40.8 08/30/2016   MCV 91 08/30/2016   PLT 336 08/30/2016     Chemistry      Component Value Date/Time   NA 138 08/30/2016 1333   K 3.9 08/30/2016 1333   CL 100 08/30/2016 1333   CO2 27 08/30/2016 1333   BUN 8 08/30/2016 1333   CREATININE 0.6 08/30/2016 1333      Component Value Date/Time   CALCIUM 9.5 08/30/2016 1333   ALKPHOS 41 08/30/2016 1333   AST 29 08/30/2016 1333   ALT 19 08/30/2016 1333   BILITOT 1.10 08/30/2016 1333         Impression and Plan: Mr. Grossberg is a 81 year old white male. He, in  my mind has metastatic pancreatic cancer. However, we really need to get a biopsy to confirm this.  I will have to speak with one of the gastroenterologists so we can try to get a biopsy quickly.  I talked to him about this. I spoke to him about our goals of care if he has metastatic disease. I told him if he has metastatic disease, which I feel he has, then our goal would be quality of life and to try to minimize symptoms. Chemotherapy could help this but would not cure the cancer by any means.  I told him that if he has metastatic pancreatic cancer that the average survival is probably only 6 months or so. With new were aggressive therapy, some patients had to live more than a year. However, for him, I would be shocked if we could even consider aggressive therapy. I would have to believe that single agent therapy would be appropriate for him.  I talked to him about his weight. I told him that weight loss is a very critical factor and one that we look at very closely. If he continues to lose weight, then this would be a very ominous factor.  [place], he will be having gained a little weight.  We will see about getting the biopsy done early in January and then getting him back and talk to him about the chemotherapy options.  I probably will have to also talked him about his wishes for life support if anything were to happen to him. I think if he were to be kept alive on a machine, he probably would not make it off.  I spent about 45 minutes with him.   Volanda Napoleon, MD 12/29/20174:30 PM

## 2016-09-18 ENCOUNTER — Encounter: Payer: Self-pay | Admitting: *Deleted

## 2016-09-20 DIAGNOSIS — K869 Disease of pancreas, unspecified: Secondary | ICD-10-CM | POA: Diagnosis not present

## 2016-09-27 DIAGNOSIS — K8689 Other specified diseases of pancreas: Secondary | ICD-10-CM | POA: Diagnosis not present

## 2016-09-27 DIAGNOSIS — R799 Abnormal finding of blood chemistry, unspecified: Secondary | ICD-10-CM | POA: Diagnosis not present

## 2016-09-27 DIAGNOSIS — I899 Noninfective disorder of lymphatic vessels and lymph nodes, unspecified: Secondary | ICD-10-CM | POA: Diagnosis not present

## 2016-09-27 DIAGNOSIS — C259 Malignant neoplasm of pancreas, unspecified: Secondary | ICD-10-CM | POA: Diagnosis not present

## 2016-09-27 DIAGNOSIS — R1013 Epigastric pain: Secondary | ICD-10-CM | POA: Diagnosis not present

## 2016-09-27 DIAGNOSIS — R634 Abnormal weight loss: Secondary | ICD-10-CM | POA: Diagnosis not present

## 2016-09-27 DIAGNOSIS — R933 Abnormal findings on diagnostic imaging of other parts of digestive tract: Secondary | ICD-10-CM | POA: Diagnosis not present

## 2016-09-27 DIAGNOSIS — R627 Adult failure to thrive: Secondary | ICD-10-CM | POA: Diagnosis not present

## 2016-09-28 ENCOUNTER — Other Ambulatory Visit: Payer: Self-pay | Admitting: Family Medicine

## 2016-09-28 ENCOUNTER — Telehealth: Payer: Self-pay

## 2016-09-28 NOTE — Telephone Encounter (Signed)
Signed, please fax in.

## 2016-09-28 NOTE — Telephone Encounter (Signed)
Patient is calling to check on the status of his Tramadol medication.  He states that he has sent a request through the pharmacy and he is just checking on the status of it.  I did advise the patient that it typically takes 2-3 business days for requests to be processed.  [phone]

## 2016-09-28 NOTE — Telephone Encounter (Signed)
Please advise 

## 2016-09-29 NOTE — Telephone Encounter (Signed)
Pt aware faxed to pharmacy

## 2016-10-05 ENCOUNTER — Other Ambulatory Visit: Payer: Medicare HMO

## 2016-10-05 ENCOUNTER — Encounter: Payer: Self-pay | Admitting: Hematology & Oncology

## 2016-10-05 ENCOUNTER — Ambulatory Visit (HOSPITAL_BASED_OUTPATIENT_CLINIC_OR_DEPARTMENT_OTHER): Payer: Medicare HMO | Admitting: Hematology & Oncology

## 2016-10-05 ENCOUNTER — Ambulatory Visit (HOSPITAL_BASED_OUTPATIENT_CLINIC_OR_DEPARTMENT_OTHER): Payer: Medicare HMO

## 2016-10-05 ENCOUNTER — Other Ambulatory Visit: Payer: Self-pay | Admitting: Hematology & Oncology

## 2016-10-05 VITALS — BP 137/87 | HR 84 | Temp 98.0°F | Resp 16 | Wt 124.0 lb

## 2016-10-05 DIAGNOSIS — R64 Cachexia: Secondary | ICD-10-CM

## 2016-10-05 DIAGNOSIS — E860 Dehydration: Secondary | ICD-10-CM | POA: Diagnosis not present

## 2016-10-05 DIAGNOSIS — C251 Malignant neoplasm of body of pancreas: Secondary | ICD-10-CM

## 2016-10-05 DIAGNOSIS — C259 Malignant neoplasm of pancreas, unspecified: Secondary | ICD-10-CM

## 2016-10-05 DIAGNOSIS — C772 Secondary and unspecified malignant neoplasm of intra-abdominal lymph nodes: Principal | ICD-10-CM

## 2016-10-05 DIAGNOSIS — R630 Anorexia: Secondary | ICD-10-CM | POA: Diagnosis not present

## 2016-10-05 HISTORY — DX: Malignant neoplasm of pancreas, unspecified: C25.9

## 2016-10-05 MED ORDER — SODIUM CHLORIDE 0.9 % IV SOLN
1000.0000 mL | Freq: Once | INTRAVENOUS | Status: AC
Start: 2016-10-05 — End: 2016-10-05
  Administered 2016-10-05: 1000 mL via INTRAVENOUS

## 2016-10-05 MED ORDER — FAMOTIDINE IN NACL 20-0.9 MG/50ML-% IV SOLN
40.0000 mg | Freq: Two times a day (BID) | INTRAVENOUS | Status: DC
  Administered 2016-10-05: 40 mg via INTRAVENOUS

## 2016-10-05 MED ORDER — SODIUM CHLORIDE 0.9 % IV SOLN
20.0000 mg | Freq: Once | INTRAVENOUS | Status: AC
  Administered 2016-10-05: 20 mg via INTRAVENOUS
  Filled 2016-10-05: qty 2

## 2016-10-05 NOTE — Patient Instructions (Signed)
Dehydration, Adult Dehydration is when there is not enough fluid or water in your body. This happens when you lose more fluids than you take in. Dehydration can range from mild to very bad. It should be treated right away to keep it from getting very bad. Symptoms of mild dehydration may include:   Thirst.  Dry lips.  Slightly dry mouth.  Dry, warm skin.  Dizziness. Symptoms of moderate dehydration may include:   Very dry mouth.  Muscle cramps.  Dark pee (urine). Pee may be the color of tea.  Your body making less pee.  Your eyes making fewer tears.  Heartbeat that is uneven or faster than normal (palpitations).  Headache.  Light-headedness, especially when you stand up from sitting.  Fainting (syncope). Symptoms of very bad dehydration may include:   Changes in skin, such as:  Cold and clammy skin.  Blotchy (mottled) or pale skin.  Skin that does not quickly return to normal after being lightly pinched and let go (poor skin turgor).  Changes in body fluids, such as:  Feeling very thirsty.  Your eyes making fewer tears.  Not sweating when body temperature is high, such as in hot weather.  Your body making very little pee.  Changes in vital signs, such as:  Weak pulse.  Pulse that is more than 100 beats a minute when you are sitting still.  Fast breathing.  Low blood pressure.  Other changes, such as:  Sunken eyes.  Cold hands and feet.  Confusion.  Lack of energy (lethargy).  Trouble waking up from sleep.  Short-term weight loss.  Unconsciousness. Follow these instructions at home:  If told by your doctor, drink an ORS:  Make an ORS by using instructions on the package.  Start by drinking small amounts, about  cup (120 mL) every 5-10 minutes.  Slowly drink more until you have had the amount that your doctor said to have.  Drink enough clear fluid to keep your pee clear or pale yellow. If you were told to drink an ORS, finish the  ORS first, then start slowly drinking clear fluids. Drink fluids such as:  Water. Do not drink only water by itself. Doing that can make the salt (sodium) level in your body get too low (hyponatremia).  Ice chips.  Fruit juice that you have added water to (diluted).  Low-calorie sports drinks.  Avoid:  Alcohol.  Drinks that have a lot of sugar. These include high-calorie sports drinks, fruit juice that does not have water added, and soda.  Caffeine.  Foods that are greasy or have a lot of fat or sugar.  Take over-the-counter and prescription medicines only as told by your doctor.  Do not take salt tablets. Doing that can make the salt level in your body get too high (hypernatremia).  Eat foods that have minerals (electrolytes). Examples include bananas, oranges, potatoes, tomatoes, and spinach.  Keep all follow-up visits as told by your doctor. This is important. Contact a doctor if:  You have belly (abdominal) pain that:  Gets worse.  Stays in one area (localizes).  You have a rash.  You have a stiff neck.  You get angry or annoyed more easily than normal (irritability).  You are more sleepy than normal.  You have a harder time waking up than normal.  You feel:  Weak.  Dizzy.  Very thirsty.  You have peed (urinated) only a small amount of very dark pee during 6-8 hours. Get help right away if:  You   have symptoms of very bad dehydration.  You cannot drink fluids without throwing up (vomiting).  Your symptoms get worse with treatment.  You have a fever.  You have a very bad headache.  You are throwing up or having watery poop (diarrhea) and it:  Gets worse.  Does not go away.  You have blood or something green (bile) in your throw-up.  You have blood in your poop (stool). This may cause poop to look black and tarry.  You have not peed in 6-8 hours.  You pass out (faint).  Your heart rate when you are sitting still is more than 100 beats a  minute.  You have trouble breathing. This information is not intended to replace advice given to you by your health care provider. Make sure you discuss any questions you have with your health care provider. Document Released: 06/30/2009 Document Revised: 03/23/2016 Document Reviewed: 10/28/2015 Elsevier Interactive Patient Education  2017 [employer]  

## 2016-10-05 NOTE — Progress Notes (Signed)
Hematology and Oncology Follow Up Visit  Frederick Fitzgerald [id number] 07/16/1935 82 y.o. 10/05/2016   Principle Diagnosis:   Pancreatic cancer - Adenocarcinoma  Current Therapy:    Observation     Interim History:  Frederick Fitzgerald is back for follow-up.we now have a diagnosis. He underwent an endoscopic ultrasound. This was done on January 11. The report, from Dr. Paulita Fujita showed a dilated pancreatic duct. There were a few malignant-appearing lymph nodes. A mass was noted in the pancreatic body. This was biopsied. The pathology report [id number]) showed adenocarcinoma.  By the endoscopic report, the stage is T3N1Mx. However, given the markedly elevated CA 19-9, I have to believe that he has metastatic disease.  His weight is going down more. We are trying everything we can to get him to a and weight. He says he just wants to eat but cannot eat a lot. He gets bloated. 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 He may be developing invasion of the intestinal neurologic plexus that is causing some digestive issues.  He is not having diarrhea. Thankfully, lactulose seems to be helping the constipation.  He is dehydrated. Again he just is not eating or drinking much. urologic plexus. Thankfully, lactulose has helped this.  There has really not been much in way of pain.  Currently, his performance status is ECOG 2.  Medications:  Current Outpatient Prescriptions:  .  Cyanocobalamin (VITAMIN B 12 PO), Take 1,000 mg by mouth daily., Disp: , Rfl:  .  Lactulose 20 GM/30ML SOLN, Take 30 mLs (20 g total) by mouth every 4 (four) hours as needed., Disp: 1000 mL, Rfl: 2 .  traMADol (ULTRAM) 50 MG tablet, TAKE 1 TABLET BY MOUTH EVERY 8 HOURS AS NEEDED, Disp: 30 tablet, Rfl: 0 .  gabapentin (NEURONTIN) 100 MG capsule, Take 1 capsule (100 mg total) by mouth 3 (three) times daily. As needed. Can start 1 po qhs initially. (Patient not taking: Reported on 10/05/2016), Disp: 90  capsule, Rfl: 1 .  Probiotic Product (PROBIOTIC PO), Take by mouth daily., Disp: , Rfl:   Current Facility-Administered Medications:  .  famotidine (PEPCID) IVPB 20 mg premix, 40 mg, Intravenous, Q12H, Volanda Napoleon, MD, 40 mg at 10/05/16 1511  Allergies: No Known Allergies  Past Medical History, Surgical history, Social history, and Family History were reviewed and updated.  Review of Systems: HEENT exam shows no ocular or oral lesions. He has no scleral icterus. He has no adenopathy in the neck. Thyroid is nonpalpable. Lungs are clear bilaterally. Cardiac exam regular rate and rhythm with an occasional extra beat. He has no murmurs, rubs or bruits. Abdomen is soft. He has no fluid wave. There is no obvious abdominal mass. He has no guarding or rebound tenderness. He has no palpable liver or spleen tip. Back exam shows no tenderness over the spine, ribs or hips. Extremities shows no clubbing, cyanosis or edema. He has good range of motion of his joints. No venous cord is noted in his legs. Skin exam shows no rashes, ecchymoses or petechia. Neurological exam shows no focal neurological deficits.   Physical Exam:  weight is 124 lb (56.2 kg). His oral temperature is 98 F (36.7 C). His blood pressure is 137/87 and his pulse is 84. His respiration is 16 and oxygen saturation is 100%.   Wt Readings from Last 3 Encounters:  10/05/16 124 lb (56.2 kg)  09/07/16 128 lb (58.1 kg)  08/30/16 127 lb (57.6 kg)     As  above  Lab Results  Component Value Date   WBC 6.7 08/30/2016   HGB 13.7 08/30/2016   HCT 40.8 08/30/2016   MCV 91 08/30/2016   PLT 336 08/30/2016     Chemistry      Component Value Date/Time   NA 138 08/30/2016 1333   K 3.9 08/30/2016 1333   CL 100 08/30/2016 1333   CO2 27 08/30/2016 1333   BUN 8 08/30/2016 1333   CREATININE 0.6 08/30/2016 1333      Component Value Date/Time   CALCIUM 9.5 08/30/2016 1333   ALKPHOS 41 08/30/2016 1333   AST 29 08/30/2016 1333   ALT 19  08/30/2016 1333   BILITOT 1.10 08/30/2016 1333         Impression and Plan: Frederick Fitzgerald is a 82 year old white male. He has biopsy confirmed adenocarcinoma the pancreas.  He is definitely having a tell time now. His performance status is not getting better. Again, the weight loss is truly bothersome.  He looks dehydrated. I will give him some IV fluids in the office. I will give him a dose of Decadron and Pepcid to see if this cannot help his appetite a little bit.   He comes in with his nephew. I talked with them at length about this situation and that the only treatment option is chemotherapy. He is not a candidate for aggressive chemotherapy. I think he would only be a candidate for single agent chemotherapy, that being gemcitabine.  I told them that the chance of chemotherapy helping probably would be about 20-30%. We would know this by his weight going up and by the CA 19-9 coming down.  He is reluctant to take treatment. I told him that if he does not take treatment or if treatment does not work, then I would probably give him less than 4 months. If he did take treatment and that treatment helped, then we might able to give him 8 months or so.   He doesn't think these are at odds. He is going to think about the situation.   He understands that this is some that is not curable. Our goal would be quality of life and some quantity of life. Trying to get him to gain weight really would be our main reason for treating him.  Again he understands that we are dealing with a situation that is not curable.  Hopefully, the IV fluids will help him.   I will plan to get him back in 2 weeks or so. I told him to call us to let us know if he makes a decision or if he thinks he needs any IV fluids.   I spent about 45 minutes with him and his nephew. I answered all their questions.   Volanda Napoleon, MD 1/19/20185:05 PM

## 2016-10-11 ENCOUNTER — Encounter: Payer: Self-pay | Admitting: Hematology & Oncology

## 2016-10-11 NOTE — Addendum Note (Signed)
Addended by: Burney Gauze R on: 10/11/2016 06:00 PM   Modules accepted: Orders

## 2016-10-11 NOTE — Progress Notes (Signed)
START ON PATHWAY REGIMEN - Pancreatic  PANOS10: Gemcitabine 1,000 mg/m2 D1, 8, 15 q28 Days Until Progression of Unacceptable Toxicity   A cycle is every 28 days (3 weeks on and 1 week off):     Gemcitabine (Gemzar(R)) 1,000 mg/m2 in 250 mL NS IV over 30 minutes days 1, 8 and 15 Dose Mod: None  **Always confirm dose/schedule in your pharmacy ordering system**    Patient Characteristics: Adenocarcinoma, Metastatic Disease, First Line, PS >= 2 Current evidence of distant metastases? Yes AJCC T Stage: 2 AJCC N Stage: 1 AJCC Stage Grouping: IV AJCC M Stage: 1 Histology: Adenocarcinoma Line of therapy: First Line Would you be surprised if this patient died  in the next year? I would NOT be surprised if this patient died in the next year  Intent of Therapy: Non-Curative / Palliative Intent, Discussed with Patient

## 2016-10-15 ENCOUNTER — Encounter: Payer: Self-pay | Admitting: Hematology & Oncology

## 2016-10-15 ENCOUNTER — Other Ambulatory Visit (HOSPITAL_BASED_OUTPATIENT_CLINIC_OR_DEPARTMENT_OTHER): Payer: Medicare HMO

## 2016-10-15 ENCOUNTER — Ambulatory Visit (HOSPITAL_BASED_OUTPATIENT_CLINIC_OR_DEPARTMENT_OTHER): Payer: Medicare HMO

## 2016-10-15 DIAGNOSIS — C259 Malignant neoplasm of pancreas, unspecified: Secondary | ICD-10-CM | POA: Diagnosis not present

## 2016-10-15 DIAGNOSIS — C251 Malignant neoplasm of body of pancreas: Secondary | ICD-10-CM

## 2016-10-15 DIAGNOSIS — C772 Secondary and unspecified malignant neoplasm of intra-abdominal lymph nodes: Principal | ICD-10-CM

## 2016-10-15 DIAGNOSIS — Z5111 Encounter for antineoplastic chemotherapy: Secondary | ICD-10-CM

## 2016-10-15 DIAGNOSIS — E860 Dehydration: Secondary | ICD-10-CM

## 2016-10-15 LAB — CBC WITH DIFFERENTIAL (CANCER CENTER ONLY)
BASO#: 0 10*3/uL (ref 0.0–0.2)
BASO%: 0.3 % (ref 0.0–2.0)
EOS%: 0.3 % (ref 0.0–7.0)
Eosinophils Absolute: 0 10*3/uL (ref 0.0–0.5)
HEMATOCRIT: 40.3 % (ref 38.7–49.9)
HEMOGLOBIN: 13.2 g/dL (ref 13.0–17.1)
LYMPH#: 1 10*3/uL (ref 0.9–3.3)
LYMPH%: 15 % (ref 14.0–48.0)
MCH: 30.8 pg (ref 28.0–33.4)
MCHC: 32.8 g/dL (ref 32.0–35.9)
MCV: 94 fL (ref 82–98)
MONO#: 0.6 10*3/uL (ref 0.1–0.9)
MONO%: 9.3 % (ref 0.0–13.0)
NEUT%: 75.1 % (ref 40.0–80.0)
NEUTROS ABS: 4.8 10*3/uL (ref 1.5–6.5)
Platelets: 321 10*3/uL (ref 145–400)
RBC: 4.29 10*6/uL (ref 4.20–5.70)
RDW: 13.5 % (ref 11.1–15.7)
WBC: 6.3 10*3/uL (ref 4.0–10.0)

## 2016-10-15 LAB — CMP (CANCER CENTER ONLY)
ALBUMIN: 3.7 g/dL (ref 3.3–5.5)
ALK PHOS: 51 U/L (ref 26–84)
ALT: 18 U/L (ref 10–47)
AST: 24 U/L (ref 11–38)
BILIRUBIN TOTAL: 0.8 mg/dL (ref 0.20–1.60)
BUN, Bld: 8 mg/dL (ref 7–22)
CALCIUM: 9.6 mg/dL (ref 8.0–10.3)
CO2: 30 mEq/L (ref 18–33)
Chloride: 97 mEq/L — ABNORMAL LOW (ref 98–108)
Creat: 0.8 mg/dl (ref 0.6–1.2)
GLUCOSE: 118 mg/dL (ref 73–118)
POTASSIUM: 4.3 meq/L (ref 3.3–4.7)
Sodium: 139 mEq/L (ref 128–145)
TOTAL PROTEIN: 6.7 g/dL (ref 6.4–8.1)

## 2016-10-15 MED ORDER — SODIUM CHLORIDE 0.9 % IV SOLN
INTRAVENOUS | Status: AC
  Administered 2016-10-15: 10:00:00 via INTRAVENOUS

## 2016-10-15 MED ORDER — SODIUM CHLORIDE 0.9 % IV SOLN
800.0000 mg/m2 | Freq: Once | INTRAVENOUS | Status: AC
  Administered 2016-10-15: 1330 mg via INTRAVENOUS
  Filled 2016-10-15: qty 10.52

## 2016-10-15 MED ORDER — PROCHLORPERAZINE MALEATE 10 MG PO TABS
ORAL_TABLET | ORAL | Status: AC
  Filled 2016-10-15: qty 1

## 2016-10-15 MED ORDER — PROCHLORPERAZINE MALEATE 10 MG PO TABS: 10.0000 mg | ORAL_TABLET | Freq: Four times a day (QID) | ORAL | 1 refills | Status: DC | PRN

## 2016-10-15 MED ORDER — PROCHLORPERAZINE MALEATE 10 MG PO TABS
10.0000 mg | ORAL_TABLET | Freq: Once | ORAL | Status: AC
  Administered 2016-10-15: 10 mg via ORAL

## 2016-10-15 MED ORDER — ONDANSETRON HCL 8 MG PO TABS: 8.0000 mg | ORAL_TABLET | Freq: Two times a day (BID) | ORAL | 1 refills | Status: DC | PRN

## 2016-10-15 MED ORDER — LORAZEPAM 0.5 MG PO TABS: 0.5000 mg | ORAL_TABLET | Freq: Four times a day (QID) | ORAL | 0 refills | Status: DC | PRN

## 2016-10-15 NOTE — Patient Instructions (Signed)
[facility] Discharge Instructions for Patients Receiving Chemotherapy  Today you received the following chemotherapy agents gemzar  To help prevent nausea and vomiting after your treatment, we encourage you to take your nausea medication  Begin taking it if nauseated and take it as often as prescribed.   If you develop nausea and vomiting that is not controlled by your nausea medication, call the clinic. If it is after clinic hours your family physician or the after hours number for the clinic or go to the Emergency Department.   BELOW ARE SYMPTOMS THAT SHOULD BE REPORTED IMMEDIATELY:  *FEVER GREATER THAN 101.0 F  *CHILLS WITH OR WITHOUT FEVER  NAUSEA AND VOMITING THAT IS NOT CONTROLLED WITH YOUR NAUSEA MEDICATION  *UNUSUAL SHORTNESS OF BREATH  *UNUSUAL BRUISING OR BLEEDING  TENDERNESS IN MOUTH AND THROAT WITH OR WITHOUT PRESENCE OF ULCERS  *URINARY PROBLEMS  *BOWEL PROBLEMS  UNUSUAL RASH Items with * indicate a potential emergency and should be followed up as soon as possible.  One of the nurses will contact you 24 hours after your treatment. Please let the nurse know about any problems that you may have experienced. Feel free to call the clinic you have any questions or concerns. The clinic phone number is [phone].   I have been informed and understand all the instructions given to me. I know to contact the clinic, my physician, or go to the Emergency Department if any problems should occur. I do not have any questions at this time, but understand that I may call the clinic during office hours or the Patient Navigator at [phone] should I have any questions or need assistance in obtaining follow up care.    __________________________________________  _____________  __________ Signature of Patient or Authorized Representative            Date                   Time    __________________________________________ Nurse's Signature

## 2016-10-16 LAB — CANCER ANTIGEN 19-9

## 2016-10-17 ENCOUNTER — Encounter: Payer: Self-pay | Admitting: *Deleted

## 2016-10-17 ENCOUNTER — Other Ambulatory Visit: Payer: Medicare HMO

## 2016-10-19 ENCOUNTER — Other Ambulatory Visit: Payer: Medicare HMO

## 2016-10-19 ENCOUNTER — Ambulatory Visit: Payer: Medicare HMO

## 2016-10-19 ENCOUNTER — Ambulatory Visit: Payer: Medicare HMO | Admitting: Hematology & Oncology

## 2016-10-23 ENCOUNTER — Other Ambulatory Visit (HOSPITAL_BASED_OUTPATIENT_CLINIC_OR_DEPARTMENT_OTHER): Payer: Medicare HMO

## 2016-10-23 ENCOUNTER — Ambulatory Visit (HOSPITAL_BASED_OUTPATIENT_CLINIC_OR_DEPARTMENT_OTHER): Payer: Medicare HMO | Admitting: Family

## 2016-10-23 ENCOUNTER — Ambulatory Visit (HOSPITAL_BASED_OUTPATIENT_CLINIC_OR_DEPARTMENT_OTHER): Payer: Medicare HMO

## 2016-10-23 VITALS — BP 137/74 | HR 97 | Temp 98.3°F | Resp 18 | Wt 125.0 lb

## 2016-10-23 DIAGNOSIS — C259 Malignant neoplasm of pancreas, unspecified: Secondary | ICD-10-CM

## 2016-10-23 DIAGNOSIS — C772 Secondary and unspecified malignant neoplasm of intra-abdominal lymph nodes: Principal | ICD-10-CM

## 2016-10-23 DIAGNOSIS — R64 Cachexia: Secondary | ICD-10-CM

## 2016-10-23 DIAGNOSIS — Z5111 Encounter for antineoplastic chemotherapy: Secondary | ICD-10-CM | POA: Diagnosis not present

## 2016-10-23 DIAGNOSIS — C189 Malignant neoplasm of colon, unspecified: Secondary | ICD-10-CM | POA: Diagnosis not present

## 2016-10-23 LAB — CBC WITH DIFFERENTIAL (CANCER CENTER ONLY)
BASO#: 0 10*3/uL (ref 0.0–0.2)
BASO%: 0.2 % (ref 0.0–2.0)
EOS%: 2.4 % (ref 0.0–7.0)
Eosinophils Absolute: 0.1 10*3/uL (ref 0.0–0.5)
HEMATOCRIT: 39.4 % (ref 38.7–49.9)
HEMOGLOBIN: 13.1 g/dL (ref 13.0–17.1)
LYMPH#: 0.7 10*3/uL — ABNORMAL LOW (ref 0.9–3.3)
LYMPH%: 15.9 % (ref 14.0–48.0)
MCH: 31.3 pg (ref 28.0–33.4)
MCHC: 33.2 g/dL (ref 32.0–35.9)
MCV: 94 fL (ref 82–98)
MONO#: 0.5 10*3/uL (ref 0.1–0.9)
MONO%: 11.1 % (ref 0.0–13.0)
NEUT#: 3.2 10*3/uL (ref 1.5–6.5)
NEUT%: 70.4 % (ref 40.0–80.0)
Platelets: 234 10*3/uL (ref 145–400)
RBC: 4.18 10*6/uL — ABNORMAL LOW (ref 4.20–5.70)
RDW: 13.4 % (ref 11.1–15.7)
WBC: 4.6 10*3/uL (ref 4.0–10.0)

## 2016-10-23 LAB — CMP (CANCER CENTER ONLY)
ALBUMIN: 3.9 g/dL (ref 3.3–5.5)
ALK PHOS: 48 U/L (ref 26–84)
ALT: 47 U/L (ref 10–47)
AST: 42 U/L — AB (ref 11–38)
BILIRUBIN TOTAL: 0.8 mg/dL (ref 0.20–1.60)
BUN, Bld: 9 mg/dL (ref 7–22)
CALCIUM: 9.8 mg/dL (ref 8.0–10.3)
CO2: 30 mEq/L (ref 18–33)
Chloride: 97 mEq/L — ABNORMAL LOW (ref 98–108)
Creat: 0.7 mg/dl (ref 0.6–1.2)
GLUCOSE: 111 mg/dL (ref 73–118)
POTASSIUM: 3.7 meq/L (ref 3.3–4.7)
Sodium: 140 mEq/L (ref 128–145)
Total Protein: 7.3 g/dL (ref 6.4–8.1)

## 2016-10-23 MED ORDER — SODIUM CHLORIDE 0.9 % IV SOLN
800.0000 mg/m2 | Freq: Once | INTRAVENOUS | Status: AC
  Administered 2016-10-23: 1330 mg via INTRAVENOUS
  Filled 2016-10-23: qty 10.48

## 2016-10-23 MED ORDER — PROCHLORPERAZINE MALEATE 10 MG PO TABS
10.0000 mg | ORAL_TABLET | Freq: Once | ORAL | Status: AC
  Administered 2016-10-23: 10 mg via ORAL

## 2016-10-23 MED ORDER — SODIUM CHLORIDE 0.9 % IV SOLN
Freq: Once | INTRAVENOUS | Status: AC
  Administered 2016-10-23: 11:00:00 via INTRAVENOUS

## 2016-10-23 MED ORDER — PROCHLORPERAZINE MALEATE 10 MG PO TABS
ORAL_TABLET | ORAL | Status: AC
Start: 2016-10-23 — End: 2016-10-23
  Filled 2016-10-23: qty 1

## 2016-10-23 NOTE — Patient Instructions (Signed)
[facility] Discharge Instructions for Patients Receiving Chemotherapy  Today you received the following chemotherapy agents gemzar  To help prevent nausea and vomiting after your treatment, we encourage you to take your nausea medication  Begin taking it if nauseated and take it as often as prescribed.   If you develop nausea and vomiting that is not controlled by your nausea medication, call the clinic. If it is after clinic hours your family physician or the after hours number for the clinic or go to the Emergency Department.   BELOW ARE SYMPTOMS THAT SHOULD BE REPORTED IMMEDIATELY:  *FEVER GREATER THAN 101.0 F  *CHILLS WITH OR WITHOUT FEVER  NAUSEA AND VOMITING THAT IS NOT CONTROLLED WITH YOUR NAUSEA MEDICATION  *UNUSUAL SHORTNESS OF BREATH  *UNUSUAL BRUISING OR BLEEDING  TENDERNESS IN MOUTH AND THROAT WITH OR WITHOUT PRESENCE OF ULCERS  *URINARY PROBLEMS  *BOWEL PROBLEMS  UNUSUAL RASH Items with * indicate a potential emergency and should be followed up as soon as possible.  One of the nurses will contact you 24 hours after your treatment. Please let the nurse know about any problems that you may have experienced. Feel free to call the clinic you have any questions or concerns. The clinic phone number is [phone].   

## 2016-10-23 NOTE — Progress Notes (Addendum)
Hematology and Oncology Follow Up Visit  BRYTEN SHURTS [id number] [birth year] 82 y.o. 10/23/2016   Principle Diagnosis:  Pancreatic adenocarcinoma - at least stage III (T3N1Mx) Current Therapy:   Gemcitabine q 7 days s/p cycle 1 - Neo-adjuvant    Interim History:  Mr. Prestenbach is here today for follow-up and cycle 2 of Gemcitabine. He tolerated his first cycle nicely and has had no problem with fever or infection. WBC count is 4.6. Hgb is stable at 13.1 with an MCV of 94.  His CA 19-9 last week was 2,568. Today's result is pending.  He is having some fatigue and arthritic pain in his joints.  No chills, n/v, cough, rash, dizziness, headache, SOB, chest pain, palpitations, abdominal pain or changes in bladder habits. Constipation is some better.  The neuropathy in his feet is unchanged. He has no swelling in his extremities.  His appetite is somewhat improved and he is eating better. His weight is up 1 lb since his last visit. He states that he is staying hydrated.  He states that he has spoken with his niece and is interested in getting a second opinion in [place] to see if he is a candidate for surgery on the prostate. He plans to follow-up with them this week.    Medications:  Allergies as of 10/23/2016   No Known Allergies     Medication List       Accurate as of 10/23/16 10:25 AM. Always use your most recent med list.          gabapentin 100 MG capsule Commonly known as:  NEURONTIN Take 1 capsule (100 mg total) by mouth 3 (three) times daily. As needed. Can start 1 po qhs initially.   Lactulose 20 GM/30ML Soln Take 30 mLs (20 g total) by mouth every 4 (four) hours as needed.   LORazepam 0.5 MG tablet Commonly known as:  ATIVAN Take 1 tablet (0.5 mg total) by mouth every 6 (six) hours as needed (Nausea or vomiting).   ondansetron 8 MG tablet Commonly known as:  ZOFRAN Take 1 tablet (8 mg total) by mouth 2 (two) times daily as needed (Nausea or vomiting).     prochlorperazine 10 MG tablet Commonly known as:  COMPAZINE Take 1 tablet (10 mg total) by mouth every 6 (six) hours as needed (Nausea or vomiting).   traMADol 50 MG tablet Commonly known as:  ULTRAM TAKE 1 TABLET BY MOUTH EVERY 8 HOURS AS NEEDED   VITAMIN B 12 PO Take 1,000 mg by mouth daily.       Allergies: No Known Allergies  Past Medical History, Surgical history, Social history, and Family History were reviewed and updated.  Review of Systems: All other 10 point review of systems is negative.   Physical Exam:  weight is 125 lb (56.7 kg). His oral temperature is 98.3 F (36.8 C). His blood pressure is 137/74 and his pulse is 97. His respiration is 18 and oxygen saturation is 100%.   Wt Readings from Last 3 Encounters:  10/23/16 125 lb (56.7 kg)  10/05/16 124 lb (56.2 kg)  09/07/16 128 lb (58.1 kg)    Ocular: Sclerae unicteric, pupils equal, round and reactive to light Ear-nose-throat: Oropharynx clear, dentition fair Lymphatic: No cervical supraclavicular or axillary adenopathy Lungs no rales or rhonchi, good excursion bilaterally Heart regular rate and rhythm, no murmur appreciated Abd soft, nontender, positive bowel sounds, no liver or spleen tip palpated on exam  MSK no focal spinal tenderness, no joint edema  Neuro: non-focal, well-oriented, appropriate affect Breasts: Deferred   Lab Results  Component Value Date   WBC 4.6 10/23/2016   HGB 13.1 10/23/2016   HCT 39.4 10/23/2016   MCV 94 10/23/2016   PLT 234 10/23/2016   No results found for: FERRITIN, IRON, TIBC, UIBC, IRONPCTSAT Lab Results  Component Value Date   RBC 4.18 (L) 10/23/2016   No results found for: KPAFRELGTCHN, LAMBDASER, KAPLAMBRATIO No results found for: IGGSERUM, IGA, IGMSERUM No results found for: Odetta Pink, SPEI   Chemistry      Component Value Date/Time   NA 139 10/15/2016 0857   K 4.3 10/15/2016 0857   CL 97 (L)  10/15/2016 0857   CO2 30 10/15/2016 0857   BUN 8 10/15/2016 0857   CREATININE 0.8 10/15/2016 0857      Component Value Date/Time   CALCIUM 9.6 10/15/2016 0857   ALKPHOS 51 10/15/2016 0857   AST 24 10/15/2016 0857   ALT 18 10/15/2016 0857   BILITOT 0.80 10/15/2016 0857     Impression and Plan: Mr. Elstad is a 82 yo white male with adenocarcinoma of the pancreas. He has had his first cycle of Gemcitabine and tolerated it well. CA 19-9 result is pending.  He has had no issue with fevers so far. He continues to have fatigue.  Wt this visit is unchanged. CBC and CMP are stable.We will continue to monitor his LFT's.  We will proceed with cycle 2 of treatment today as planned. I discussed possible side effects with him and he will notify us if he develops a temperature of 100.5 of greater, any other infection related symptoms or abdominal pain/jaundice.  He has his current treatment and appointment schedule.  Both he and his family know to contact our office with any questions or concerns. We can certainly see him sooner if need be.   Eliezer Bottom, NP 2/6/201810:25 AM   ADDENDUM:  Overall, Mr. Fayette seems to be doing pretty well. He has tolerated the treatment alright. He did have a little bit of temperature after the first cycle. This is not uncommon with gemcitabine.  Given that his overall performance status is ECOG 2, he really is not a candidate for aggressive combination therapy.  His markedly elevated CA 19-9 indicates that he has micrometastatic disease. However, I want to try to give him the "benefit of the doubt" and maybe think that he just has locally advanced disease.  His CA 19-9 has come down a little bit.  I have still not yet ruled out the possibility of surgery. However, on the endoscopic ultrasound, he really is not a obvious surgical candidate because of relationship between the tumor and the SMV.   We will continue to treat him with gemcitabine. Maybe, just  maybe, if his performance status improves significantly, we might be able to add Abraxane. However, with Abraxane comes  hair loss.  We'll continue to "stay the course". His weight will definitely tell us how he is doing. I will continue to encourage him to take marijuana.Lattie Haw, MD

## 2016-10-24 LAB — CANCER ANTIGEN 19-9: CA 19-9: 2145 U/mL — ABNORMAL HIGH (ref 0–35)

## 2016-10-24 LAB — PREALBUMIN: PREALBUMIN: 20 mg/dL (ref 9–32)

## 2016-10-30 ENCOUNTER — Other Ambulatory Visit (HOSPITAL_BASED_OUTPATIENT_CLINIC_OR_DEPARTMENT_OTHER): Payer: Medicare HMO

## 2016-10-30 ENCOUNTER — Telehealth: Payer: Self-pay | Admitting: *Deleted

## 2016-10-30 ENCOUNTER — Ambulatory Visit (HOSPITAL_BASED_OUTPATIENT_CLINIC_OR_DEPARTMENT_OTHER): Payer: Medicare HMO

## 2016-10-30 VITALS — BP 135/71 | HR 81 | Temp 98.4°F | Resp 17

## 2016-10-30 DIAGNOSIS — C772 Secondary and unspecified malignant neoplasm of intra-abdominal lymph nodes: Principal | ICD-10-CM

## 2016-10-30 DIAGNOSIS — C259 Malignant neoplasm of pancreas, unspecified: Secondary | ICD-10-CM | POA: Diagnosis not present

## 2016-10-30 DIAGNOSIS — C187 Malignant neoplasm of sigmoid colon: Secondary | ICD-10-CM | POA: Diagnosis not present

## 2016-10-30 DIAGNOSIS — K869 Disease of pancreas, unspecified: Secondary | ICD-10-CM | POA: Diagnosis not present

## 2016-10-30 DIAGNOSIS — Z5111 Encounter for antineoplastic chemotherapy: Secondary | ICD-10-CM | POA: Diagnosis not present

## 2016-10-30 LAB — CMP (CANCER CENTER ONLY)
ALT(SGPT): 86 U/L — ABNORMAL HIGH (ref 10–47)
AST: 61 U/L — AB (ref 11–38)
Albumin: 3.7 g/dL (ref 3.3–5.5)
Alkaline Phosphatase: 54 U/L (ref 26–84)
BUN: 12 mg/dL (ref 7–22)
CHLORIDE: 98 meq/L (ref 98–108)
CO2: 29 meq/L (ref 18–33)
CREATININE: 0.9 mg/dL (ref 0.6–1.2)
Calcium: 9.4 mg/dL (ref 8.0–10.3)
Glucose, Bld: 100 mg/dL (ref 73–118)
Potassium: 4.1 mEq/L (ref 3.3–4.7)
SODIUM: 138 meq/L (ref 128–145)
TOTAL PROTEIN: 6.7 g/dL (ref 6.4–8.1)
Total Bilirubin: 0.7 mg/dl (ref 0.20–1.60)

## 2016-10-30 LAB — CBC WITH DIFFERENTIAL (CANCER CENTER ONLY)
BASO#: 0 10*3/uL (ref 0.0–0.2)
BASO%: 0.3 % (ref 0.0–2.0)
EOS%: 0 % (ref 0.0–7.0)
Eosinophils Absolute: 0 10*3/uL (ref 0.0–0.5)
HCT: 36.8 % — ABNORMAL LOW (ref 38.7–49.9)
HGB: 12.3 g/dL — ABNORMAL LOW (ref 13.0–17.1)
LYMPH#: 1 10*3/uL (ref 0.9–3.3)
LYMPH%: 29.9 % (ref 14.0–48.0)
MCH: 31.5 pg (ref 28.0–33.4)
MCHC: 33.4 g/dL (ref 32.0–35.9)
MCV: 94 fL (ref 82–98)
MONO#: 0.4 10*3/uL (ref 0.1–0.9)
MONO%: 11.3 % (ref 0.0–13.0)
NEUT#: 1.9 10*3/uL (ref 1.5–6.5)
NEUT%: 58.5 % (ref 40.0–80.0)
PLATELETS: 171 10*3/uL (ref 145–400)
RBC: 3.91 10*6/uL — ABNORMAL LOW (ref 4.20–5.70)
RDW: 13.3 % (ref 11.1–15.7)
WBC: 3.2 10*3/uL — AB (ref 4.0–10.0)

## 2016-10-30 MED ORDER — SODIUM CHLORIDE 0.9 % IV SOLN
Freq: Once | INTRAVENOUS | Status: AC
  Administered 2016-10-30: 12:00:00 via INTRAVENOUS

## 2016-10-30 MED ORDER — SODIUM CHLORIDE 0.9 % IV SOLN
800.0000 mg/m2 | Freq: Once | INTRAVENOUS | Status: AC
  Administered 2016-10-30: 1330 mg via INTRAVENOUS
  Filled 2016-10-30: qty 24.7

## 2016-10-30 MED ORDER — PROCHLORPERAZINE MALEATE 10 MG PO TABS
ORAL_TABLET | ORAL | Status: AC
  Filled 2016-10-30: qty 1

## 2016-10-30 MED ORDER — PROCHLORPERAZINE MALEATE 10 MG PO TABS
10.0000 mg | ORAL_TABLET | Freq: Once | ORAL | Status: AC
  Administered 2016-10-30: 10 mg via ORAL

## 2016-10-30 NOTE — Telephone Encounter (Signed)
Patient seeking out second opinion. Requesting records to sent to  [facility] [phone]  Requests also sent to   Radiology to mail imaging disk Pathology to send slides

## 2016-10-30 NOTE — Patient Instructions (Signed)
[facility] Discharge Instructions for Patients Receiving Chemotherapy  Today you received the following chemotherapy agents gemzar  To help prevent nausea and vomiting after your treatment, we encourage you to take your nausea medication  Begin taking it if nauseated and take it as often as prescribed.   If you develop nausea and vomiting that is not controlled by your nausea medication, call the clinic. If it is after clinic hours your family physician or the after hours number for the clinic or go to the Emergency Department.   BELOW ARE SYMPTOMS THAT SHOULD BE REPORTED IMMEDIATELY:  *FEVER GREATER THAN 101.0 F  *CHILLS WITH OR WITHOUT FEVER  NAUSEA AND VOMITING THAT IS NOT CONTROLLED WITH YOUR NAUSEA MEDICATION  *UNUSUAL SHORTNESS OF BREATH  *UNUSUAL BRUISING OR BLEEDING  TENDERNESS IN MOUTH AND THROAT WITH OR WITHOUT PRESENCE OF ULCERS  *URINARY PROBLEMS  *BOWEL PROBLEMS  UNUSUAL RASH Items with * indicate a potential emergency and should be followed up as soon as possible.  One of the nurses will contact you 24 hours after your treatment. Please let the nurse know about any problems that you may have experienced. Feel free to call the clinic you have any questions or concerns. The clinic phone number is [phone].   

## 2016-10-30 NOTE — Progress Notes (Signed)
Reviewed pt labs with Dr. Marin Olp (CBC and CMP) Dr. Marin Olp aware of pt ALT 86 and per Dr. Marin Olp pt ok to treat

## 2016-10-31 LAB — PREALBUMIN: PREALBUMIN: 19 mg/dL (ref 9–32)

## 2016-11-06 ENCOUNTER — Other Ambulatory Visit (HOSPITAL_BASED_OUTPATIENT_CLINIC_OR_DEPARTMENT_OTHER): Payer: Medicare HMO

## 2016-11-06 ENCOUNTER — Other Ambulatory Visit: Payer: Self-pay | Admitting: Pharmacist

## 2016-11-06 ENCOUNTER — Encounter: Payer: Self-pay | Admitting: Hematology & Oncology

## 2016-11-06 ENCOUNTER — Ambulatory Visit (HOSPITAL_BASED_OUTPATIENT_CLINIC_OR_DEPARTMENT_OTHER): Payer: Medicare HMO

## 2016-11-06 ENCOUNTER — Ambulatory Visit (HOSPITAL_BASED_OUTPATIENT_CLINIC_OR_DEPARTMENT_OTHER): Payer: Medicare HMO | Admitting: Family

## 2016-11-06 VITALS — BP 124/74 | HR 73 | Temp 98.4°F | Resp 18 | Wt 122.0 lb

## 2016-11-06 DIAGNOSIS — C259 Malignant neoplasm of pancreas, unspecified: Secondary | ICD-10-CM

## 2016-11-06 DIAGNOSIS — C772 Secondary and unspecified malignant neoplasm of intra-abdominal lymph nodes: Principal | ICD-10-CM

## 2016-11-06 DIAGNOSIS — Z5111 Encounter for antineoplastic chemotherapy: Secondary | ICD-10-CM

## 2016-11-06 DIAGNOSIS — E860 Dehydration: Secondary | ICD-10-CM

## 2016-11-06 DIAGNOSIS — Z9189 Other specified personal risk factors, not elsewhere classified: Secondary | ICD-10-CM

## 2016-11-06 LAB — CMP (CANCER CENTER ONLY)
ALBUMIN: 3.6 g/dL (ref 3.3–5.5)
ALT(SGPT): 114 U/L — ABNORMAL HIGH (ref 10–47)
AST: 73 U/L — ABNORMAL HIGH (ref 11–38)
Alkaline Phosphatase: 49 U/L (ref 26–84)
BUN, Bld: 10 mg/dL (ref 7–22)
CALCIUM: 9.4 mg/dL (ref 8.0–10.3)
CHLORIDE: 92 meq/L — AB (ref 98–108)
CO2: 29 mEq/L (ref 18–33)
Creat: 0.7 mg/dl (ref 0.6–1.2)
Glucose, Bld: 105 mg/dL (ref 73–118)
Potassium: 4 mEq/L (ref 3.3–4.7)
Sodium: 136 mEq/L (ref 128–145)
Total Bilirubin: 0.7 mg/dl (ref 0.20–1.60)
Total Protein: 6.7 g/dL (ref 6.4–8.1)

## 2016-11-06 LAB — CBC WITH DIFFERENTIAL (CANCER CENTER ONLY)
BASO#: 0 10*3/uL (ref 0.0–0.2)
BASO%: 0.3 % (ref 0.0–2.0)
EOS ABS: 0 10*3/uL (ref 0.0–0.5)
EOS%: 0 % (ref 0.0–7.0)
HEMATOCRIT: 37.1 % — AB (ref 38.7–49.9)
HEMOGLOBIN: 12.5 g/dL — AB (ref 13.0–17.1)
LYMPH#: 1.1 10*3/uL (ref 0.9–3.3)
LYMPH%: 31.5 % (ref 14.0–48.0)
MCH: 31.6 pg (ref 28.0–33.4)
MCHC: 33.7 g/dL (ref 32.0–35.9)
MCV: 94 fL (ref 82–98)
MONO#: 0.6 10*3/uL (ref 0.1–0.9)
MONO%: 17.2 % — AB (ref 0.0–13.0)
NEUT%: 51 % (ref 40.0–80.0)
NEUTROS ABS: 1.7 10*3/uL (ref 1.5–6.5)
Platelets: 248 10*3/uL (ref 145–400)
RBC: 3.96 10*6/uL — ABNORMAL LOW (ref 4.20–5.70)
RDW: 13.6 % (ref 11.1–15.7)
WBC: 3.4 10*3/uL — ABNORMAL LOW (ref 4.0–10.0)

## 2016-11-06 MED ORDER — PANCRELIPASE (LIP-PROT-AMYL) 24000-76000 UNITS PO CPEP: 72000.0000 [IU] | ORAL_CAPSULE | Freq: Three times a day (TID) | ORAL | 3 refills | Status: DC

## 2016-11-06 MED ORDER — SODIUM CHLORIDE 0.9 % IV SOLN
Freq: Once | INTRAVENOUS | Status: AC
  Administered 2016-11-06: 14:00:00 via INTRAVENOUS

## 2016-11-06 MED ORDER — PROCHLORPERAZINE MALEATE 10 MG PO TABS
10.0000 mg | ORAL_TABLET | Freq: Once | ORAL | Status: AC
  Administered 2016-11-06: 10 mg via ORAL

## 2016-11-06 MED ORDER — SODIUM CHLORIDE 0.9 % IV SOLN: 1200.0000 mg | Freq: Once | INTRAVENOUS | Status: DC

## 2016-11-06 MED ORDER — SODIUM CHLORIDE 0.9 % IV SOLN
720.0000 mg/m2 | Freq: Once | INTRAVENOUS | Status: AC
  Administered 2016-11-06: 1216 mg via INTRAVENOUS
  Filled 2016-11-06: qty 5.68

## 2016-11-06 NOTE — Progress Notes (Signed)
Samples given today: Creon 24,000 units Lipase    4 bottles x 12 capsules each Lot:  [id number]  Exp:  07/19/17

## 2016-11-06 NOTE — Progress Notes (Signed)
Hematology and Oncology Follow Up Visit  EUAL GUIDICE [id number] 1935-03-09 82 y.o. 11/06/2016   Principle Diagnosis:  Pancreatic adenocarcinoma - at least stage III (T3N1Mx)  Current Therapy:   Gemcitabine q 7 days s/p cycle 3 - Neo-adjuvant    Interim History:  Mr. Desantos is here today for follow-up and treatment. He states that after his last treatment he lost a good bit of his ability to taste. His appetite is down and he has lost another 3 lbs. This is concerning. He is still using cannabis and takes 1/2 an ultram for break through pain. He prefers to stay as natural with his treatment as possible.  He is a vegetarian and only eats limited amounts of cheese and occasionally an egg with his grits.  His CA 19-9 level 2 weeks ago was 2,145.  He denies fatigue but has had some chills.  No fever, n/v, cough, rash, dizziness, headache, SOB, chest pain, palpitations, abdominal pain or changes in bladder habits. He has still been dealing with constipation, no diarrhea.  He has dry mouth and has tried biotene with no relief. He does not want a prescription for this so we will have him try gargling baking soda and water.  The neuropathy in his feet is unchanged. He has no swelling in his extremities.   Medications:  Allergies as of 11/06/2016   No Known Allergies     Medication List       Accurate as of 11/06/16  1:10 PM. Always use your most recent med list.          gabapentin 100 MG capsule Commonly known as:  NEURONTIN Take 1 capsule (100 mg total) by mouth 3 (three) times daily. As needed. Can start 1 po qhs initially.   Lactulose 20 GM/30ML Soln Take 30 mLs (20 g total) by mouth every 4 (four) hours as needed.   LORazepam 0.5 MG tablet Commonly known as:  ATIVAN Take 1 tablet (0.5 mg total) by mouth every 6 (six) hours as needed (Nausea or vomiting).   ondansetron 8 MG tablet Commonly known as:  ZOFRAN Take 1 tablet (8 mg total) by mouth 2 (two) times daily as  needed (Nausea or vomiting).   prochlorperazine 10 MG tablet Commonly known as:  COMPAZINE Take 1 tablet (10 mg total) by mouth every 6 (six) hours as needed (Nausea or vomiting).   traMADol 50 MG tablet Commonly known as:  ULTRAM TAKE 1 TABLET BY MOUTH EVERY 8 HOURS AS NEEDED   VITAMIN B 12 PO Take 1,000 mg by mouth daily.       Allergies: No Known Allergies  Past Medical History, Surgical history, Social history, and Family History were reviewed and updated.  Review of Systems: All other 10 point review of systems is negative.   Physical Exam:  vitals were not taken for this visit.  Wt Readings from Last 3 Encounters:  10/23/16 125 lb (56.7 kg)  10/05/16 124 lb (56.2 kg)  09/07/16 128 lb (58.1 kg)    Ocular: Sclerae unicteric, pupils equal, round and reactive to light Ear-nose-throat: Oropharynx clear, dentition fair Lymphatic: No cervical supraclavicular or axillary adenopathy Lungs no rales or rhonchi, good excursion bilaterally Heart regular rate and rhythm, no murmur appreciated Abd soft, nontender, positive bowel sounds, no liver or spleen tip palpated on exam  MSK no focal spinal tenderness, no joint edema Neuro: non-focal, well-oriented, appropriate affect Breasts: Deferred   Lab Results  Component Value Date   WBC 3.2 (L) 10/30/2016  HGB 12.3 (L) 10/30/2016   HCT 36.8 (L) 10/30/2016   MCV 94 10/30/2016   PLT 171 10/30/2016   No results found for: FERRITIN, IRON, TIBC, UIBC, IRONPCTSAT Lab Results  Component Value Date   RBC 3.91 (L) 10/30/2016   No results found for: KPAFRELGTCHN, LAMBDASER, KAPLAMBRATIO No results found for: IGGSERUM, IGA, IGMSERUM No results found for: Ronnald Ramp, A1GS, A2GS, Tillman Sers, SPEI   Chemistry      Component Value Date/Time   NA 138 10/30/2016 1129   K 4.1 10/30/2016 1129   CL 98 10/30/2016 1129   CO2 29 10/30/2016 1129   BUN 12 10/30/2016 1129   CREATININE 0.9 10/30/2016 1129       Component Value Date/Time   CALCIUM 9.4 10/30/2016 1129   ALKPHOS 54 10/30/2016 1129   AST 61 (H) 10/30/2016 1129   ALT 86 (H) 10/30/2016 1129   BILITOT 0.70 10/30/2016 1129     Impression and Plan: Mr. Masoud is a 82 yo white male with adenocarcinoma of the pancreas. He has completed 3 cycles of Gemcitabine and has had some issues with loss of taste and appetite and also continues to slowly lose weight.  We will have him start Creon 72,000 units TID with meals to help with digestion. Hopefully this will help with his constipation and increase his appetite.  His CMP is indicative of dehydration so we will give him fluids today while he is here.  We will proceed with cycle 4 today as planned per Dr. Marin Olp.  He has his current treatment and appointment schedule.  Both he and his family know to contact our office with any questions or concerns. We can certainly see him sooner if need be.   Eliezer Bottom, NP 2/20/20181:10 PM

## 2016-11-13 ENCOUNTER — Other Ambulatory Visit: Payer: Self-pay | Admitting: *Deleted

## 2016-11-13 MED ORDER — PANCRELIPASE (LIP-PROT-AMYL) 24000-76000 UNITS PO CPEP: 72000.0000 [IU] | ORAL_CAPSULE | Freq: Three times a day (TID) | ORAL | 3 refills | Status: AC

## 2016-11-20 ENCOUNTER — Ambulatory Visit: Payer: Medicare HMO

## 2016-11-20 ENCOUNTER — Other Ambulatory Visit: Payer: Medicare HMO

## 2016-11-20 ENCOUNTER — Ambulatory Visit: Payer: Medicare HMO | Admitting: Hematology & Oncology

## 2016-11-27 ENCOUNTER — Ambulatory Visit (HOSPITAL_BASED_OUTPATIENT_CLINIC_OR_DEPARTMENT_OTHER): Payer: Medicare HMO

## 2016-11-27 ENCOUNTER — Ambulatory Visit (HOSPITAL_BASED_OUTPATIENT_CLINIC_OR_DEPARTMENT_OTHER): Payer: Medicare HMO | Admitting: Hematology & Oncology

## 2016-11-27 ENCOUNTER — Encounter: Payer: Self-pay | Admitting: Hematology & Oncology

## 2016-11-27 ENCOUNTER — Other Ambulatory Visit (HOSPITAL_BASED_OUTPATIENT_CLINIC_OR_DEPARTMENT_OTHER): Payer: Medicare HMO

## 2016-11-27 VITALS — BP 118/75 | HR 78 | Temp 97.9°F | Resp 18

## 2016-11-27 DIAGNOSIS — G609 Hereditary and idiopathic neuropathy, unspecified: Secondary | ICD-10-CM

## 2016-11-27 DIAGNOSIS — C259 Malignant neoplasm of pancreas, unspecified: Secondary | ICD-10-CM

## 2016-11-27 DIAGNOSIS — C772 Secondary and unspecified malignant neoplasm of intra-abdominal lymph nodes: Secondary | ICD-10-CM | POA: Diagnosis not present

## 2016-11-27 DIAGNOSIS — Z5111 Encounter for antineoplastic chemotherapy: Secondary | ICD-10-CM

## 2016-11-27 DIAGNOSIS — N289 Disorder of kidney and ureter, unspecified: Secondary | ICD-10-CM

## 2016-11-27 LAB — CMP (CANCER CENTER ONLY)
ALBUMIN: 3.6 g/dL (ref 3.3–5.5)
ALT(SGPT): 35 U/L (ref 10–47)
AST: 33 U/L (ref 11–38)
Alkaline Phosphatase: 55 U/L (ref 26–84)
BILIRUBIN TOTAL: 0.8 mg/dL (ref 0.20–1.60)
BUN, Bld: 15 mg/dL (ref 7–22)
CALCIUM: 9.6 mg/dL (ref 8.0–10.3)
CHLORIDE: 98 meq/L (ref 98–108)
CO2: 29 meq/L (ref 18–33)
Creat: 0.8 mg/dl (ref 0.6–1.2)
GLUCOSE: 100 mg/dL (ref 73–118)
POTASSIUM: 4.8 meq/L — AB (ref 3.3–4.7)
Sodium: 142 mEq/L (ref 128–145)
Total Protein: 7.3 g/dL (ref 6.4–8.1)

## 2016-11-27 LAB — CBC WITH DIFFERENTIAL (CANCER CENTER ONLY)
BASO#: 0.1 10*3/uL (ref 0.0–0.2)
BASO%: 0.4 % (ref 0.0–2.0)
EOS ABS: 0.1 10*3/uL (ref 0.0–0.5)
EOS%: 0.5 % (ref 0.0–7.0)
HEMATOCRIT: 38.3 % — AB (ref 38.7–49.9)
HEMOGLOBIN: 12.8 g/dL — AB (ref 13.0–17.1)
LYMPH#: 1.2 10*3/uL (ref 0.9–3.3)
LYMPH%: 10.2 % — ABNORMAL LOW (ref 14.0–48.0)
MCH: 31.6 pg (ref 28.0–33.4)
MCHC: 33.4 g/dL (ref 32.0–35.9)
MCV: 95 fL (ref 82–98)
MONO#: 1.5 10*3/uL — ABNORMAL HIGH (ref 0.1–0.9)
MONO%: 12.5 % (ref 0.0–13.0)
NEUT%: 76.4 % (ref 40.0–80.0)
NEUTROS ABS: 8.8 10*3/uL — AB (ref 1.5–6.5)
Platelets: 653 10*3/uL — ABNORMAL HIGH (ref 145–400)
RBC: 4.05 10*6/uL — ABNORMAL LOW (ref 4.20–5.70)
RDW: 15.9 % — AB (ref 11.1–15.7)
WBC: 11.6 10*3/uL — ABNORMAL HIGH (ref 4.0–10.0)

## 2016-11-27 MED ORDER — PROCHLORPERAZINE MALEATE 10 MG PO TABS
ORAL_TABLET | ORAL | Status: AC
  Filled 2016-11-27: qty 1

## 2016-11-27 MED ORDER — SODIUM CHLORIDE 0.9 % IV SOLN
648.0000 mg/m2 | Freq: Once | INTRAVENOUS | Status: AC
  Administered 2016-11-27: 1064 mg via INTRAVENOUS
  Filled 2016-11-27: qty 5.18

## 2016-11-27 MED ORDER — AMOXICILLIN-POT CLAVULANATE 875-125 MG PO TABS: 1.0000 | ORAL_TABLET | Freq: Two times a day (BID) | ORAL | 0 refills | Status: DC

## 2016-11-27 MED ORDER — SODIUM CHLORIDE 0.9 % IV SOLN
Freq: Once | INTRAVENOUS | Status: AC
  Administered 2016-11-27: 12:00:00 via INTRAVENOUS

## 2016-11-27 MED ORDER — PROCHLORPERAZINE MALEATE 10 MG PO TABS
10.0000 mg | ORAL_TABLET | Freq: Once | ORAL | Status: AC
  Administered 2016-11-27: 10 mg via ORAL

## 2016-11-27 NOTE — Progress Notes (Signed)
Hematology and Oncology Follow Up Visit  Frederick Fitzgerald [id number] 06/16/1935 82 y.o. 11/27/2016   Principle Diagnosis:  Pancreatic adenocarcinoma - at least stage III (T3N1Mx) vs Stage IV  Current Therapy:   Gemcitabine q 7 days s/p cycle 3 - Neo-adjuvant - s/p cycle #4    Interim History:  Frederick Fitzgerald is here today for follow-up and treatment. I did get the consultation back from [place]. There was a second opinion that was given by Dr. Clementeen Graham.  She was very thorough in her consultation. She agreed that gemcitabine was appropriate. She also felt that he likely had stage IV disease based on his CA 19-9.   He is on a little bit of dose reduced gemcitabine. I think this is all that he could tolerate.  He has little bit of a cough. It is not that productive. However, I would like to get him set up with some antibiotics with some Augmentin. I talked to him about this. I explained why I thought we should try Augmentin. Because he is on chemotherapy, he is a little bit immunocompromised. I do not want to have him develop pneumonia. He is agreeable to this. I told him that he would take the Augmentin for 5 days.  His appetite is marginal. He was put on some Creon to try to help with his appetite. There was little bit of confusion when he was put on the Creon but I explained what Creon does and how it works.  He is not having any abdominal pain. He said he had a normal bowel movement yesterday.  He's had some night sweats. This started over the weekend. He says it has gotten a little bit better.   He's had no rashes. He's had no fever. There's been no leading. He's had no mouth sores.   Overall, his performance status is ECOG 2, at best.    Medications:  Allergies as of 11/27/2016   No Known Allergies     Medication List       Accurate as of 11/27/16  1:27 PM. Always use your most recent med list.          gabapentin 100 MG capsule Commonly known as:  NEURONTIN Take 1 capsule  (100 mg total) by mouth 3 (three) times daily. As needed. Can start 1 po qhs initially.   Lactulose 20 GM/30ML Soln Take 30 mLs (20 g total) by mouth every 4 (four) hours as needed.   LORazepam 0.5 MG tablet Commonly known as:  ATIVAN Take 1 tablet (0.5 mg total) by mouth every 6 (six) hours as needed (Nausea or vomiting).   ondansetron 8 MG tablet Commonly known as:  ZOFRAN Take 1 tablet (8 mg total) by mouth 2 (two) times daily as needed (Nausea or vomiting).   Pancrelipase (Lip-Prot-Amyl) 24000-76000 units Cpep Commonly known as:  CREON Take 3 capsules (72,000 Units total) by mouth 3 (three) times daily before meals.   prochlorperazine 10 MG tablet Commonly known as:  COMPAZINE Take 1 tablet (10 mg total) by mouth every 6 (six) hours as needed (Nausea or vomiting).   traMADol 50 MG tablet Commonly known as:  ULTRAM TAKE 1 TABLET BY MOUTH EVERY 8 HOURS AS NEEDED   VITAMIN B 12 PO Take 1,000 mg by mouth daily.       Allergies: No Known Allergies  Past Medical History, Surgical history, Social history, and Family History were reviewed and updated.  Review of Systems: All other 10 point review of systems is  negative.   Physical Exam:  vitals were not taken for this visit.  Wt Readings from Last 3 Encounters:  11/06/16 122 lb (55.3 kg)  10/23/16 125 lb (56.7 kg)  10/05/16 124 lb (56.2 kg)    Thin white male in no obvious distress. Head and neck exam shows some slight temporal muscle wasting. He has some slight ptosis of the left eye. This is chronic. He has no oral lesions. There is no mucositis. He has no adenopathy in the neck. Lungs are with some slight decrease at the bases. He has good breath sounds bilaterally. Cardiac exam regular rate and rhythm with no murmurs, rubs or bruits. Abdomen is soft. Bowel sounds are active. There is no guarding or rebound tenderness. There is no fluid wave. There is no obvious abdominal mass. There is no palpable liver or spleen tip.  Back exam shows no tenderness over the spine, ribs or hips. Extremities shows no clubbing, cyanosis or edema. He has a most likely in upper and lower extremities. Skin exam shows no rashes, ecchymoses or petechia. Neurological exam shows no focal neurological deficits.    Lab Results  Component Value Date   WBC 11.6 (H) 11/27/2016   HGB 12.8 (L) 11/27/2016   HCT 38.3 (L) 11/27/2016   MCV 95 11/27/2016   PLT 653 (H) 11/27/2016   No results found for: FERRITIN, IRON, TIBC, UIBC, IRONPCTSAT Lab Results  Component Value Date   RBC 4.05 (L) 11/27/2016   No results found for: KPAFRELGTCHN, LAMBDASER, KAPLAMBRATIO No results found for: IGGSERUM, IGA, IGMSERUM No results found for: Ronnald Ramp, A1GS, A2GS, Tillman Sers, SPEI   Chemistry      Component Value Date/Time   NA 142 11/27/2016 1137   K 4.8 (H) 11/27/2016 1137   CL 98 11/27/2016 1137   CO2 29 11/27/2016 1137   BUN 15 11/27/2016 1137   CREATININE 0.8 11/27/2016 1137      Component Value Date/Time   CALCIUM 9.6 11/27/2016 1137   ALKPHOS 55 11/27/2016 1137   AST 33 11/27/2016 1137   ALT 35 11/27/2016 1137   BILITOT 0.80 11/27/2016 1137     Impression and Plan: Frederick Fitzgerald is a 82 yo white male with adenocarcinoma of the pancreas. Again, I would say that he clinically has stage IV disease. His CA 19-9 has been very elevated. We will have to see what his value was today.  I have to believe that he does have micrometastatic disease.  We will go ahead and get him set up with scans in a couple weeks. I will like to see what the scans show.  I just don't think that he would be a good candidate for any kind of surgical intervention. He just does not have a good performance status for the, surgery that would be necessary for pancreatic cancer.  Hopefully, we will see that his CA 19-9 is coming down.  I might consider some radiation therapy to the pancreas to panel what the CT scan shows.   I spent  about 40 minutes with Frederick Fitzgerald today. I went over a lot of my recommendations to him. We will reduce his dose of chemotherapy a little bit. This is really about quality of life for him. I does want him to be functional and active.   I will see him back in a 3 weeks. I really think that he could use a little bit of a break from treatment so we can see how he recovers and  maybe his performance status will improve.Volanda Napoleon, MD 3/13/20181:27 PM

## 2016-11-28 LAB — CANCER ANTIGEN 19-9

## 2016-12-03 ENCOUNTER — Ambulatory Visit
Admission: RE | Admit: 2016-12-03 | Discharge: 2016-12-03 | Disposition: A | Discharge status: Home / Self Care | Payer: Medicare HMO | Source: Ambulatory Visit | Attending: Hematology & Oncology | Admitting: Hematology & Oncology

## 2016-12-03 DIAGNOSIS — C772 Secondary and unspecified malignant neoplasm of intra-abdominal lymph nodes: Principal | ICD-10-CM

## 2016-12-03 DIAGNOSIS — C259 Malignant neoplasm of pancreas, unspecified: Secondary | ICD-10-CM

## 2016-12-03 MED ORDER — IOPAMIDOL (ISOVUE-300) INJECTION 61%
100.0000 mL | Freq: Once | INTRAVENOUS | Status: AC | PRN
  Administered 2016-12-03: 100 mL via INTRAVENOUS

## 2016-12-04 ENCOUNTER — Ambulatory Visit: Payer: Medicare HMO | Admitting: Hematology & Oncology

## 2016-12-04 ENCOUNTER — Other Ambulatory Visit: Payer: Medicare HMO

## 2016-12-04 ENCOUNTER — Ambulatory Visit: Payer: Medicare HMO

## 2016-12-11 ENCOUNTER — Ambulatory Visit: Payer: Medicare HMO

## 2016-12-11 ENCOUNTER — Other Ambulatory Visit: Payer: Medicare HMO

## 2016-12-12 ENCOUNTER — Telehealth: Payer: Self-pay | Admitting: *Deleted

## 2016-12-12 ENCOUNTER — Other Ambulatory Visit: Payer: Self-pay | Admitting: *Deleted

## 2016-12-12 MED ORDER — HYDROCODONE-ACETAMINOPHEN 5-325 MG PO TABS: 1.0000 | ORAL_TABLET | Freq: Four times a day (QID) | ORAL | 0 refills | Status: DC | PRN

## 2016-12-12 MED ORDER — PANTOPRAZOLE SODIUM 40 MG PO TBEC: 40.0000 mg | DELAYED_RELEASE_TABLET | Freq: Two times a day (BID) | ORAL | 3 refills | Status: DC

## 2016-12-12 NOTE — Telephone Encounter (Signed)
Patient c/o abdominal distention and pain to the right upper quadrant after his dinner yesterday afternoon. He continues to take his creon and is using tramadol for his pain. The tramadol is not effective.   Dr Marin Olp would like the patient to start on protonix 40mg  BID and prescribe vicoden for pain.   Patient is aware of new prescriptions. He knows to come to the office for the vicoden and pharmacy confirmed for the protonix

## 2016-12-18 ENCOUNTER — Ambulatory Visit (HOSPITAL_BASED_OUTPATIENT_CLINIC_OR_DEPARTMENT_OTHER): Payer: Medicare HMO

## 2016-12-18 ENCOUNTER — Other Ambulatory Visit: Payer: Self-pay | Admitting: Family

## 2016-12-18 ENCOUNTER — Ambulatory Visit (HOSPITAL_BASED_OUTPATIENT_CLINIC_OR_DEPARTMENT_OTHER): Payer: Medicare HMO | Admitting: Hematology & Oncology

## 2016-12-18 ENCOUNTER — Other Ambulatory Visit (HOSPITAL_BASED_OUTPATIENT_CLINIC_OR_DEPARTMENT_OTHER): Payer: Medicare HMO

## 2016-12-18 VITALS — BP 123/80 | HR 84 | Temp 98.2°F | Resp 16 | Wt 119.8 lb

## 2016-12-18 DIAGNOSIS — C259 Malignant neoplasm of pancreas, unspecified: Secondary | ICD-10-CM | POA: Diagnosis not present

## 2016-12-18 DIAGNOSIS — C772 Secondary and unspecified malignant neoplasm of intra-abdominal lymph nodes: Principal | ICD-10-CM

## 2016-12-18 LAB — CBC WITH DIFFERENTIAL (CANCER CENTER ONLY)
BASO#: 0 10*3/uL (ref 0.0–0.2)
BASO%: 0.3 % (ref 0.0–2.0)
EOS ABS: 0 10*3/uL (ref 0.0–0.5)
EOS%: 0.4 % (ref 0.0–7.0)
HEMATOCRIT: 38.1 % — AB (ref 38.7–49.9)
HGB: 12.8 g/dL — ABNORMAL LOW (ref 13.0–17.1)
LYMPH#: 1.1 10*3/uL (ref 0.9–3.3)
LYMPH%: 15 % (ref 14.0–48.0)
MCH: 32.2 pg (ref 28.0–33.4)
MCHC: 33.6 g/dL (ref 32.0–35.9)
MCV: 96 fL (ref 82–98)
MONO#: 0.8 10*3/uL (ref 0.1–0.9)
MONO%: 10.4 % (ref 0.0–13.0)
NEUT#: 5.4 10*3/uL (ref 1.5–6.5)
NEUT%: 73.9 % (ref 40.0–80.0)
PLATELETS: 363 10*3/uL (ref 145–400)
RBC: 3.98 10*6/uL — AB (ref 4.20–5.70)
RDW: 15.9 % — ABNORMAL HIGH (ref 11.1–15.7)
WBC: 7.3 10*3/uL (ref 4.0–10.0)

## 2016-12-18 LAB — CMP (CANCER CENTER ONLY)
ALBUMIN: 3.7 g/dL (ref 3.3–5.5)
ALK PHOS: 48 U/L (ref 26–84)
ALT: 35 U/L (ref 10–47)
AST: 39 U/L — ABNORMAL HIGH (ref 11–38)
BUN: 17 mg/dL (ref 7–22)
CALCIUM: 9.5 mg/dL (ref 8.0–10.3)
CHLORIDE: 100 meq/L (ref 98–108)
CO2: 29 mEq/L (ref 18–33)
Creat: 0.6 mg/dl (ref 0.6–1.2)
Glucose, Bld: 105 mg/dL (ref 73–118)
POTASSIUM: 4.1 meq/L (ref 3.3–4.7)
Sodium: 138 mEq/L (ref 128–145)
TOTAL PROTEIN: 7.2 g/dL (ref 6.4–8.1)
Total Bilirubin: 0.9 mg/dl (ref 0.20–1.60)

## 2016-12-18 MED ORDER — SODIUM CHLORIDE 0.9 % IV SOLN
1000.0000 mL | Freq: Once | INTRAVENOUS | Status: AC
  Administered 2016-12-18: 1000 mL via INTRAVENOUS

## 2016-12-18 NOTE — Progress Notes (Signed)
Hematology and Oncology Follow Up Visit  Frederick Fitzgerald [id number] 1935/02/13 82 y.o. 12/18/2016   Principle Diagnosis:  Pancreatic adenocarcinoma - at least stage III (T3N1Mx) vs Stage IV  Current Therapy:   Gemcitabine q 7 days s/p cycle #4   Interim History:  Frederick Fitzgerald is here today for follow-up and treatment. We did go ahead and get a CT scan on him. This was done on March 19. The CT scan showed slightly decreased pancreatic mass. There was no evidence of obvious metastatic disease. He has some unchanged small lymph nodes in the upper abdomen.  His CA 19-9 was up to 2500.  His weight continues to drop. He just cannot eat a lot of food. I do have him on Creon. He says a lot of what he eats "does not agree with me". He gets bloated.  It is possible that he might benefit from a celiac plexus block. I taught him about this.  He has had no nausea or vomiting. He still has issues with his bowels. He does not go all that often.  He has had no fever. He has had no bleeding.  He has had no cough. He has had no mouth sores.  Again, his weight continues to drop. I think this is highly indicative of his disease status.  Overall, his performance status is ECOG 2, at best.    Medications:  Allergies as of 12/18/2016   No Known Allergies     Medication List       Accurate as of 12/18/16  1:23 PM. Always use your most recent med list.          HYDROcodone-acetaminophen 5-325 MG tablet Commonly known as:  NORCO Take 1-2 tablets by mouth every 6 (six) hours as needed for moderate pain.   Lactulose 20 GM/30ML Soln Take 30 mLs (20 g total) by mouth every 4 (four) hours as needed.   LORazepam 0.5 MG tablet Commonly known as:  ATIVAN Take 1 tablet (0.5 mg total) by mouth every 6 (six) hours as needed (Nausea or vomiting).   Pancrelipase (Lip-Prot-Amyl) 24000-76000 units Cpep Commonly known as:  CREON Take 3 capsules (72,000 Units total) by mouth 3 (three) times daily before  meals.   pantoprazole 40 MG tablet Commonly known as:  PROTONIX Take 1 tablet (40 mg total) by mouth 2 (two) times daily.   VITAMIN B 12 PO Take 1,000 mg by mouth daily.       Allergies: No Known Allergies  Past Medical History, Surgical history, Social history, and Family History were reviewed and updated.  Review of Systems: All other 10 point review of systems is negative.   Physical Exam:  weight is 119 lb 12.8 oz (54.3 kg). His oral temperature is 98.2 F (36.8 C). His blood pressure is 123/80 and his pulse is 84. His respiration is 16 and oxygen saturation is 99%.   Wt Readings from Last 3 Encounters:  12/18/16 119 lb 12.8 oz (54.3 kg)  11/06/16 122 lb (55.3 kg)  10/23/16 125 lb (56.7 kg)    Thin white male in no obvious distress. Head and neck exam shows some slight temporal muscle wasting. He has some slight ptosis of the left eye. This is chronic. He has no oral lesions. There is no mucositis. He has no adenopathy in the neck. Lungs are with some slight decrease at the bases. He has good breath sounds bilaterally. Cardiac exam regular rate and rhythm with no murmurs, rubs or bruits. Abdomen is  soft. Bowel sounds are active. There is no guarding or rebound tenderness. There is no fluid wave. There is no obvious abdominal mass. There is no palpable liver or spleen tip. Back exam shows no tenderness over the spine, ribs or hips. Extremities shows no clubbing, cyanosis or edema. He has a most likely in upper and lower extremities. Skin exam shows no rashes, ecchymoses or petechia. Neurological exam shows no focal neurological deficits.    Lab Results  Component Value Date   WBC 7.3 12/18/2016   HGB 12.8 (L) 12/18/2016   HCT 38.1 (L) 12/18/2016   MCV 96 12/18/2016   PLT 363 12/18/2016   No results found for: FERRITIN, IRON, TIBC, UIBC, IRONPCTSAT Lab Results  Component Value Date   RBC 3.98 (L) 12/18/2016   No results found for: KPAFRELGTCHN, LAMBDASER,  KAPLAMBRATIO No results found for: IGGSERUM, IGA, IGMSERUM No results found for: Odetta Pink, SPEI   Chemistry      Component Value Date/Time   NA 138 12/18/2016 1135   K 4.1 12/18/2016 1135   CL 100 12/18/2016 1135   CO2 29 12/18/2016 1135   BUN 17 12/18/2016 1135   CREATININE 0.6 12/18/2016 1135      Component Value Date/Time   CALCIUM 9.5 12/18/2016 1135   ALKPHOS 48 12/18/2016 1135   AST 39 (H) 12/18/2016 1135   ALT 35 12/18/2016 1135   BILITOT 0.90 12/18/2016 1135     Impression and Plan: Frederick Fitzgerald is a 82 yo white male with adenocarcinoma of the pancreas. Again, I would say that he clinically has stage IV disease. His CA 19-9 has been very elevated. We will have to see what his value was today.  The weight loss still bothers me quite a bit.  I have to believe that he has a static disease. By the CA 19-9, I have to believe that his disease is fairly extensive.  It is and be very very difficult for [place] to change his treatment protocol. He is not a candidate for combination therapy. Possibly, he might be a candidate for Xeloda as a single agent. I just don't see that he would tolerate Xeloda with gemcitabine.  I told him that if his weight continues to go down, we will not be able to treat him as his body will not be able to handle chemotherapy.  I do not see a role for radiation therapy given his markedly elevated CA 19-9.  I did talk to him about the possibility of celiac plexus block. This might help some of the discomfort that he is having. I told him of this would be an outpatient procedure and that Dr. Paulita Fujita might be able to do this. He will think about this.   I will just give him IV fluids today.  I will like to see him back in one week.  I spent about 45 minutes with him. I reviewed his scans.  He has talked about this immunotherapy protocol by some doctor in [place]. This cost $50,000 up front. I told  him that high didn't think that this would be something for him and that it would be money that he would never see again.  Volanda Napoleon, MD 4/3/20181:23 PM

## 2016-12-19 LAB — CANCER ANTIGEN 19-9: CA 19-9: 2994 U/mL — ABNORMAL HIGH (ref 0–35)

## 2016-12-24 ENCOUNTER — Ambulatory Visit (HOSPITAL_BASED_OUTPATIENT_CLINIC_OR_DEPARTMENT_OTHER): Payer: Medicare HMO | Admitting: Hematology & Oncology

## 2016-12-24 ENCOUNTER — Other Ambulatory Visit (HOSPITAL_BASED_OUTPATIENT_CLINIC_OR_DEPARTMENT_OTHER): Payer: Medicare HMO

## 2016-12-24 ENCOUNTER — Ambulatory Visit (HOSPITAL_BASED_OUTPATIENT_CLINIC_OR_DEPARTMENT_OTHER): Payer: Medicare HMO

## 2016-12-24 VITALS — BP 110/68 | HR 82 | Temp 98.0°F | Resp 18 | Wt 122.4 lb

## 2016-12-24 DIAGNOSIS — C259 Malignant neoplasm of pancreas, unspecified: Secondary | ICD-10-CM

## 2016-12-24 DIAGNOSIS — C772 Secondary and unspecified malignant neoplasm of intra-abdominal lymph nodes: Principal | ICD-10-CM

## 2016-12-24 DIAGNOSIS — Z5111 Encounter for antineoplastic chemotherapy: Secondary | ICD-10-CM | POA: Diagnosis not present

## 2016-12-24 DIAGNOSIS — K869 Disease of pancreas, unspecified: Secondary | ICD-10-CM | POA: Diagnosis not present

## 2016-12-24 LAB — CMP (CANCER CENTER ONLY)
ALT: 30 U/L (ref 10–47)
AST: 32 U/L (ref 11–38)
Albumin: 3.5 g/dL (ref 3.3–5.5)
Alkaline Phosphatase: 49 U/L (ref 26–84)
BILIRUBIN TOTAL: 0.7 mg/dL (ref 0.20–1.60)
BUN, Bld: 21 mg/dL (ref 7–22)
CALCIUM: 9.3 mg/dL (ref 8.0–10.3)
CHLORIDE: 100 meq/L (ref 98–108)
CO2: 28 meq/L (ref 18–33)
Creat: 0.9 mg/dl (ref 0.6–1.2)
GLUCOSE: 90 mg/dL (ref 73–118)
POTASSIUM: 3.8 meq/L (ref 3.3–4.7)
Sodium: 137 mEq/L (ref 128–145)
Total Protein: 6.5 g/dL (ref 6.4–8.1)

## 2016-12-24 LAB — CBC WITH DIFFERENTIAL (CANCER CENTER ONLY)
BASO#: 0 10*3/uL (ref 0.0–0.2)
BASO%: 0.3 % (ref 0.0–2.0)
EOS%: 0.7 % (ref 0.0–7.0)
Eosinophils Absolute: 0.1 10*3/uL (ref 0.0–0.5)
HEMATOCRIT: 35.3 % — AB (ref 38.7–49.9)
HEMOGLOBIN: 11.7 g/dL — AB (ref 13.0–17.1)
LYMPH#: 1.5 10*3/uL (ref 0.9–3.3)
LYMPH%: 19.8 % (ref 14.0–48.0)
MCH: 32.1 pg (ref 28.0–33.4)
MCHC: 33.1 g/dL (ref 32.0–35.9)
MCV: 97 fL (ref 82–98)
MONO#: 0.8 10*3/uL (ref 0.1–0.9)
MONO%: 10.1 % (ref 0.0–13.0)
NEUT%: 69.1 % (ref 40.0–80.0)
NEUTROS ABS: 5.3 10*3/uL (ref 1.5–6.5)
Platelets: 316 10*3/uL (ref 145–400)
RBC: 3.65 10*6/uL — AB (ref 4.20–5.70)
RDW: 15.9 % — ABNORMAL HIGH (ref 11.1–15.7)
WBC: 7.7 10*3/uL (ref 4.0–10.0)

## 2016-12-24 MED ORDER — PROCHLORPERAZINE MALEATE 10 MG PO TABS
10.0000 mg | ORAL_TABLET | Freq: Once | ORAL | Status: AC
  Administered 2016-12-24: 10 mg via ORAL

## 2016-12-24 MED ORDER — CAPECITABINE 500 MG PO TABS: ORAL_TABLET | ORAL | 4 refills | Status: DC

## 2016-12-24 MED ORDER — PROCHLORPERAZINE MALEATE 10 MG PO TABS
ORAL_TABLET | ORAL | Status: AC
  Filled 2016-12-24: qty 1

## 2016-12-24 MED ORDER — SODIUM CHLORIDE 0.9 % IV SOLN
648.0000 mg/m2 | Freq: Once | INTRAVENOUS | Status: AC
  Administered 2016-12-24: 1064 mg via INTRAVENOUS
  Filled 2016-12-24: qty 26.3

## 2016-12-24 MED ORDER — SODIUM CHLORIDE 0.9 % IV SOLN
Freq: Once | INTRAVENOUS | Status: AC
  Administered 2016-12-24: 15:00:00 via INTRAVENOUS

## 2016-12-24 NOTE — Progress Notes (Signed)
Hematology and Oncology Follow Up Visit  Frederick Fitzgerald [id number] 1935/03/04 82 y.o. 12/24/2016   Principle Diagnosis:  Pancreatic adenocarcinoma - at least stage III (T3N1Mx) vs Stage IV  Current Therapy:   Gemcitabine q 7 days s/p cycle #4   Interim History:  Frederick Fitzgerald is here today for follow-up.  I saw him a week ago. I was worried about his weight loss. I just did not think that we needed to treat him with his weight going down. I told him that I just we have a very hard time treating him less his weight goes back up.  We last saw him, his CA-19-9 also went up. Is now close to 3000. Despite the fact that his CT scan does not show any obvious metastatic disease, I have to believe that he has metastatic disease that is contributing to the marked CA 19-9 elevation.  He says that he will eats twice a day. I told him that he needs to have 4 or 5 small meals a day. Hopefully he will do this.  His niece, who lives up [place], gave him the name of a doctor in [place]. 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 His niece told Frederick Fitzgerald that he should consider radiosurgery. I'm not sure that he would be considered a candidate but I will check into this.  He is eating a little better. His weight has gone up a bit.  Overall, his performance status is ECOG 2, at best.    Medications:  Allergies as of 12/24/2016   No Known Allergies     Medication List       Accurate as of 12/24/16  2:00 PM. Always use your most recent med list.          HYDROcodone-acetaminophen 5-325 MG tablet Commonly known as:  NORCO Take 1-2 tablets by mouth every 6 (six) hours as needed for moderate pain.   Lactulose 20 GM/30ML Soln Take 30 mLs (20 g total) by mouth every 4 (four) hours as needed.   LORazepam 0.5 MG tablet Commonly known as:  ATIVAN Take 1 tablet (0.5 mg total) by mouth every 6 (six) hours as needed (Nausea or vomiting).     ondansetron 8 MG tablet Commonly known as:  ZOFRAN   Pancrelipase (Lip-Prot-Amyl) 24000-76000 units Cpep Commonly known as:  CREON Take 3 capsules (72,000 Units total) by mouth 3 (three) times daily before meals.   pantoprazole 40 MG tablet Commonly known as:  PROTONIX Take 1 tablet (40 mg total) by mouth 2 (two) times daily.   prochlorperazine 10 MG tablet Commonly known as:  COMPAZINE   traMADol 50 MG tablet Commonly known as:  ULTRAM   VITAMIN B 12 PO Take 1,000 mg by mouth daily.       Allergies: No Known Allergies  Past Medical History, Surgical history, Social history, and Family History were reviewed and updated.  Review of Systems: All other 10 point review of systems is negative.   Physical Exam:  weight is 122 lb 6.4 oz (55.5 kg). His oral temperature is 98 F (36.7 C). His blood pressure is 110/68 and his pulse is 82. His respiration is 18 and oxygen saturation is 98%.   Wt Readings from Last 3 Encounters:  12/24/16 122 lb 6.4 oz (55.5 kg)  12/18/16 119 lb 12.8 oz (54.3 kg)  11/06/16 122 lb (55.3 kg)    Thin white male  in no obvious distress. Head and neck exam shows some slight temporal muscle wasting. He has some slight ptosis of the left eye. This is chronic. He has no oral lesions. There is no mucositis. He has no adenopathy in the neck. Lungs are with some slight decrease at the bases. He has good breath sounds bilaterally. Cardiac exam regular rate and rhythm with no murmurs, rubs or bruits. Abdomen is soft. Bowel sounds are active. There is no guarding or rebound tenderness. There is no fluid wave. There is no obvious abdominal mass. There is no palpable liver or spleen tip. Back exam shows no tenderness over the spine, ribs or hips. Extremities shows no clubbing, cyanosis or edema. He has a most likely in upper and lower extremities. Skin exam shows no rashes, ecchymoses or petechia. Neurological exam shows no focal neurological deficits.    Lab Results   Component Value Date   WBC 7.7 12/24/2016   HGB 11.7 (L) 12/24/2016   HCT 35.3 (L) 12/24/2016   MCV 97 12/24/2016   PLT 316 12/24/2016   No results found for: FERRITIN, IRON, TIBC, UIBC, IRONPCTSAT Lab Results  Component Value Date   RBC 3.65 (L) 12/24/2016   No results found for: KPAFRELGTCHN, LAMBDASER, KAPLAMBRATIO No results found for: IGGSERUM, IGA, IGMSERUM No results found for: Odetta Pink, SPEI   Chemistry      Component Value Date/Time   NA 137 12/24/2016 1321   K 3.8 12/24/2016 1321   CL 100 12/24/2016 1321   CO2 28 12/24/2016 1321   BUN 21 12/24/2016 1321   CREATININE 0.9 12/24/2016 1321      Component Value Date/Time   CALCIUM 9.3 12/24/2016 1321   ALKPHOS 49 12/24/2016 1321   AST 32 12/24/2016 1321   ALT 30 12/24/2016 1321   BILITOT 0.70 12/24/2016 1321     Impression and Plan: Frederick Fitzgerald is a 82 yo white male with adenocarcinoma of the pancreas. Again, I would say that he clinically has stage IV disease. His CA 19-9 has been very elevated.   I think that we probably have to make some kind of changing his therapy. I think we probably are going to need to consider adding Xeloda to the gemcitabine. This might be able to get a bit of a better response and get his CA 19-9 down.  I talked to him about Xeloda. I explained him the side effects. I talked about the possibility of diarrhea, skin rash, mouth sores, redness and cracking of the hands and feet.  He definitely is not going to be a candidate for full dose Xeloda. I would give him dose reduced Xeloda for only 10 days out of every 21 day cycle. Hopefully he would be able to tolerate this.  Cost might be a factor.  We will have to see how much this will cost.  I will go ahead with his gemcitabine today. This I think he is tolerated pretty well. If we just get him to gain a more weight.  If we get him onto Xeloda, I would like to change his gemcitabine  to every 14 day dosing.   I spent about 40 minutes with him today.  We will have to follow him weekly for right now.   Volanda Napoleon, MD 4/9/20182:00 PM

## 2016-12-24 NOTE — Patient Instructions (Signed)
[facility] Discharge Instructions for Patients Receiving Chemotherapy  Today you received the following chemotherapy agents Gemzar  To help prevent nausea and vomiting after your treatment, we encourage you to take your nausea medication as directed.    If you develop nausea and vomiting that is not controlled by your nausea medication, call the clinic.   BELOW ARE SYMPTOMS THAT SHOULD BE REPORTED IMMEDIATELY:  *FEVER GREATER THAN 100.5 F  *CHILLS WITH OR WITHOUT FEVER  NAUSEA AND VOMITING THAT IS NOT CONTROLLED WITH YOUR NAUSEA MEDICATION  *UNUSUAL SHORTNESS OF BREATH  *UNUSUAL BRUISING OR BLEEDING  TENDERNESS IN MOUTH AND THROAT WITH OR WITHOUT PRESENCE OF ULCERS  *URINARY PROBLEMS  *BOWEL PROBLEMS  UNUSUAL RASH Items with * indicate a potential emergency and should be followed up as soon as possible.  Feel free to call the clinic you have any questions or concerns. The clinic phone number is [phone].  Please show the CHEMO ALERT CARD at check-in to the Emergency Department and triage nurse.   

## 2016-12-25 LAB — LACTATE DEHYDROGENASE: LDH: 184 U/L (ref 125–245)

## 2016-12-25 LAB — PREALBUMIN: PREALBUMIN: 22 mg/dL (ref 9–32)

## 2016-12-25 LAB — CANCER ANTIGEN 19-9: CA 19-9: 2801 U/mL — ABNORMAL HIGH (ref 0–35)

## 2016-12-28 DIAGNOSIS — R69 Illness, unspecified: Secondary | ICD-10-CM | POA: Diagnosis not present

## 2017-01-01 ENCOUNTER — Ambulatory Visit (HOSPITAL_BASED_OUTPATIENT_CLINIC_OR_DEPARTMENT_OTHER): Payer: Medicare HMO

## 2017-01-01 ENCOUNTER — Ambulatory Visit (HOSPITAL_BASED_OUTPATIENT_CLINIC_OR_DEPARTMENT_OTHER): Payer: Medicare HMO | Admitting: Family

## 2017-01-01 ENCOUNTER — Other Ambulatory Visit (HOSPITAL_BASED_OUTPATIENT_CLINIC_OR_DEPARTMENT_OTHER): Payer: Medicare HMO

## 2017-01-01 VITALS — BP 136/83 | HR 94 | Temp 98.0°F | Resp 18 | Wt 120.1 lb

## 2017-01-01 DIAGNOSIS — C259 Malignant neoplasm of pancreas, unspecified: Secondary | ICD-10-CM

## 2017-01-01 DIAGNOSIS — C772 Secondary and unspecified malignant neoplasm of intra-abdominal lymph nodes: Principal | ICD-10-CM

## 2017-01-01 DIAGNOSIS — Z5111 Encounter for antineoplastic chemotherapy: Secondary | ICD-10-CM

## 2017-01-01 LAB — CBC WITH DIFFERENTIAL (CANCER CENTER ONLY)
BASO#: 0 10*3/uL (ref 0.0–0.2)
BASO%: 0 % (ref 0.0–2.0)
EOS%: 0.1 % (ref 0.0–7.0)
Eosinophils Absolute: 0 10*3/uL (ref 0.0–0.5)
HCT: 37.1 % — ABNORMAL LOW (ref 38.7–49.9)
HGB: 12.4 g/dL — ABNORMAL LOW (ref 13.0–17.1)
LYMPH#: 0.9 10*3/uL (ref 0.9–3.3)
LYMPH%: 8.4 % — AB (ref 14.0–48.0)
MCH: 32.3 pg (ref 28.0–33.4)
MCHC: 33.4 g/dL (ref 32.0–35.9)
MCV: 97 fL (ref 82–98)
MONO#: 0.9 10*3/uL (ref 0.1–0.9)
MONO%: 7.8 % (ref 0.0–13.0)
NEUT%: 83.7 % — AB (ref 40.0–80.0)
NEUTROS ABS: 9.3 10*3/uL — AB (ref 1.5–6.5)
Platelets: 199 10*3/uL (ref 145–400)
RBC: 3.84 10*6/uL — ABNORMAL LOW (ref 4.20–5.70)
RDW: 15.2 % (ref 11.1–15.7)
WBC: 11.1 10*3/uL — AB (ref 4.0–10.0)

## 2017-01-01 LAB — CMP (CANCER CENTER ONLY)
ALBUMIN: 3.6 g/dL (ref 3.3–5.5)
ALT(SGPT): 70 U/L — ABNORMAL HIGH (ref 10–47)
AST: 56 U/L — ABNORMAL HIGH (ref 11–38)
Alkaline Phosphatase: 51 U/L (ref 26–84)
BILIRUBIN TOTAL: 1.1 mg/dL (ref 0.20–1.60)
BUN, Bld: 13 mg/dL (ref 7–22)
CALCIUM: 9.4 mg/dL (ref 8.0–10.3)
CHLORIDE: 97 meq/L — AB (ref 98–108)
CO2: 29 mEq/L (ref 18–33)
CREATININE: 0.8 mg/dL (ref 0.6–1.2)
Glucose, Bld: 115 mg/dL (ref 73–118)
Potassium: 3.6 mEq/L (ref 3.3–4.7)
SODIUM: 137 meq/L (ref 128–145)
TOTAL PROTEIN: 7 g/dL (ref 6.4–8.1)

## 2017-01-01 MED ORDER — PROCHLORPERAZINE MALEATE 10 MG PO TABS
10.0000 mg | ORAL_TABLET | Freq: Once | ORAL | Status: AC
Start: 2017-01-01 — End: 2017-01-01
  Administered 2017-01-01: 10 mg via ORAL

## 2017-01-01 MED ORDER — SODIUM CHLORIDE 0.9 % IV SOLN
Freq: Once | INTRAVENOUS | Status: AC
  Administered 2017-01-01: 12:00:00 via INTRAVENOUS

## 2017-01-01 MED ORDER — SODIUM CHLORIDE 0.9 % IV SOLN
648.0000 mg/m2 | Freq: Once | INTRAVENOUS | Status: AC
  Administered 2017-01-01: 1064 mg via INTRAVENOUS
  Filled 2017-01-01: qty 22.73

## 2017-01-01 MED ORDER — PROCHLORPERAZINE MALEATE 10 MG PO TABS
ORAL_TABLET | ORAL | Status: AC
  Filled 2017-01-01: qty 1

## 2017-01-01 NOTE — Progress Notes (Signed)
Hematology and Oncology Follow Up Visit  KHALIN ROYCE [id number] [birth year] 82 y.o. 01/01/2017   Principle Diagnosis:  Pancreatic adenocarcinoma - at least stage III (T3N1Mx) vs Stage IV  Current Therapy:   Gemcitabine q 7 days s/p cycle 6   Interim History:  Mr. Bertran is here today for follow-up and treatment. He is doing fairly well. He states that he is taking his pain medication as prescribed and that his pain is much more controlled.  His CA 19-9 was 2,801 last week. He states that he has not started the Xeloda and is not sure he wants to start it. He is concerned with the possibility of side effects (constipation, fatigue, trouble sleeping, etc.). We again went over the risks and benefits of trying the medication and he is still unsure. He would like more time to think about which is fine. We will respect his wishes.  He still does not have much of an appetite and admits that he could do better about staying hydrated.  He states that he has had some bloating and "tightness" in his abdomen with certain foods. His weight is down another 2 lbs. He is using cannabis as an appetite stimulant at times.  No fever, chills, n/v, cough, rash, dizziness, SOB, chest pain, palpitations, abdominal pain or changes in bowel or bladder habits.   No swelling or tenderness in her extremities.  The neuropathy in his hands and feet is unchanged.   ECOG Performance Status: 2 - Symptomatic, <50% confined to bed  Medications:  Allergies as of 01/01/2017   No Known Allergies     Medication List       Accurate as of 01/01/17 10:38 AM. Always use your most recent med list.          capecitabine 500 MG tablet Commonly known as:  XELODA Take 3 pills twice a day with food for 10 days on and 11 days off.   HYDROcodone-acetaminophen 5-325 MG tablet Commonly known as:  NORCO Take 1-2 tablets by mouth every 6 (six) hours as needed for moderate pain.   Lactulose 20 GM/30ML Soln Take 30 mLs (20 g  total) by mouth every 4 (four) hours as needed.   LORazepam 0.5 MG tablet Commonly known as:  ATIVAN Take 1 tablet (0.5 mg total) by mouth every 6 (six) hours as needed (Nausea or vomiting).   ondansetron 8 MG tablet Commonly known as:  ZOFRAN   Pancrelipase (Lip-Prot-Amyl) 24000-76000 units Cpep Commonly known as:  CREON Take 3 capsules (72,000 Units total) by mouth 3 (three) times daily before meals.   pantoprazole 40 MG tablet Commonly known as:  PROTONIX Take 1 tablet (40 mg total) by mouth 2 (two) times daily.   prochlorperazine 10 MG tablet Commonly known as:  COMPAZINE   traMADol 50 MG tablet Commonly known as:  ULTRAM   VITAMIN B 12 PO Take 1,000 mg by mouth daily.       Allergies: No Known Allergies  Past Medical History, Surgical history, Social history, and Family History were reviewed and updated.  Review of Systems: All other 10 point review of systems is negative.   Physical Exam:  weight is 120 lb 1.9 oz (54.5 kg). His oral temperature is 98 F (36.7 C). His blood pressure is 136/83 and his pulse is 94. His respiration is 18 and oxygen saturation is 100%.   Wt Readings from Last 3 Encounters:  01/01/17 120 lb 1.9 oz (54.5 kg)  12/24/16 122 lb 6.4 oz (55.5  kg)  12/18/16 119 lb 12.8 oz (54.3 kg)    Ocular: Sclerae unicteric, pupils equal, round and reactive to light Ear-nose-throat: Oropharynx clear, dentition fair Lymphatic: No cervical, supraclavicular or axillary adenopathy Lungs no rales or rhonchi, good excursion bilaterally Heart regular rate and rhythm, no murmur appreciated Abd soft, nontender, positive bowel sounds, no liver or spleen tip palpated on exam, no fluid wave MSK no focal spinal tenderness, no joint edema Neuro: non-focal, well-oriented, appropriate affect Breasts: Deferred   Lab Results  Component Value Date   WBC 11.1 (H) 01/01/2017   HGB 12.4 (L) 01/01/2017   HCT 37.1 (L) 01/01/2017   MCV 97 01/01/2017   PLT 199  01/01/2017   No results found for: FERRITIN, IRON, TIBC, UIBC, IRONPCTSAT Lab Results  Component Value Date   RBC 3.84 (L) 01/01/2017   No results found for: KPAFRELGTCHN, LAMBDASER, KAPLAMBRATIO No results found for: IGGSERUM, IGA, IGMSERUM No results found for: Odetta Pink, SPEI   Chemistry      Component Value Date/Time   NA 137 12/24/2016 1321   K 3.8 12/24/2016 1321   CL 100 12/24/2016 1321   CO2 28 12/24/2016 1321   BUN 21 12/24/2016 1321   CREATININE 0.9 12/24/2016 1321      Component Value Date/Time   CALCIUM 9.3 12/24/2016 1321   ALKPHOS 49 12/24/2016 1321   AST 32 12/24/2016 1321   ALT 30 12/24/2016 1321   BILITOT 0.70 12/24/2016 1321      Impression and Plan: Mr.Andersonis a 82 yo white male with adenocarcinoma of the pancreas felt to be likely stage IV based on his persistently elevated CA 19-9. His pain is being better managed with his current medication regimen.  We will proceed with treatment cycle 7 today as planned per Dr. Marin Olp.   He plans to think about whether or not he would like to be treated with Xeloda and let us know.  We discussed again the importance of him eating 4-5 small meals a day and or supplementing with ensure or boost. He states that he will try but just does not have an appetite. He will also try to stay better hydrated.  I spent at least 25 minutes face to face counseling with the patient.  He has his current treatment and appointment schedule.  He will contact our office with any questions or concerns. We can certainly see him sooner if need be.   Eliezer Bottom, NP 4/17/201810:38 AM

## 2017-01-01 NOTE — Patient Instructions (Signed)
[facility] Discharge Instructions for Patients Receiving Chemotherapy  Today you received the following chemotherapy agents Gemzar  To help prevent nausea and vomiting after your treatment, we encourage you to take your nausea medication as directed.    If you develop nausea and vomiting that is not controlled by your nausea medication, call the clinic.   BELOW ARE SYMPTOMS THAT SHOULD BE REPORTED IMMEDIATELY:  *FEVER GREATER THAN 100.5 F  *CHILLS WITH OR WITHOUT FEVER  NAUSEA AND VOMITING THAT IS NOT CONTROLLED WITH YOUR NAUSEA MEDICATION  *UNUSUAL SHORTNESS OF BREATH  *UNUSUAL BRUISING OR BLEEDING  TENDERNESS IN MOUTH AND THROAT WITH OR WITHOUT PRESENCE OF ULCERS  *URINARY PROBLEMS  *BOWEL PROBLEMS  UNUSUAL RASH Items with * indicate a potential emergency and should be followed up as soon as possible.  Feel free to call the clinic you have any questions or concerns. The clinic phone number is [phone].  Please show the CHEMO ALERT CARD at check-in to the Emergency Department and triage nurse.   

## 2017-01-02 LAB — CANCER ANTIGEN 19-9

## 2017-01-09 ENCOUNTER — Ambulatory Visit: Payer: Medicare HMO

## 2017-01-09 ENCOUNTER — Ambulatory Visit: Payer: Medicare HMO | Admitting: Hematology & Oncology

## 2017-01-09 ENCOUNTER — Other Ambulatory Visit: Payer: Medicare HMO

## 2017-01-11 ENCOUNTER — Telehealth: Payer: Self-pay | Admitting: *Deleted

## 2017-01-11 NOTE — Telephone Encounter (Signed)
Patient notifying the office that the during the night he had an episode where he got up to urinate but was unable to. He states he just waited, and was eventually able to empty his bladder. He states that today he has had no issues with urination. He denies pain. His bowels are at baseline of 2-3x/week.   Spoke to Dr Marin Olp. At this time we don't need any intervention since patient has been symptom free today. He can call back to the office if symptoms return.

## 2017-01-15 ENCOUNTER — Ambulatory Visit (HOSPITAL_BASED_OUTPATIENT_CLINIC_OR_DEPARTMENT_OTHER): Payer: Medicare HMO

## 2017-01-15 ENCOUNTER — Ambulatory Visit (HOSPITAL_BASED_OUTPATIENT_CLINIC_OR_DEPARTMENT_OTHER): Payer: Medicare HMO | Admitting: Hematology & Oncology

## 2017-01-15 ENCOUNTER — Other Ambulatory Visit (HOSPITAL_BASED_OUTPATIENT_CLINIC_OR_DEPARTMENT_OTHER): Payer: Medicare HMO

## 2017-01-15 VITALS — BP 127/75 | HR 80 | Temp 98.1°F | Resp 18 | Wt 121.0 lb

## 2017-01-15 DIAGNOSIS — C772 Secondary and unspecified malignant neoplasm of intra-abdominal lymph nodes: Principal | ICD-10-CM

## 2017-01-15 DIAGNOSIS — C259 Malignant neoplasm of pancreas, unspecified: Secondary | ICD-10-CM

## 2017-01-15 DIAGNOSIS — Z5111 Encounter for antineoplastic chemotherapy: Secondary | ICD-10-CM

## 2017-01-15 LAB — CMP (CANCER CENTER ONLY)
ALK PHOS: 57 U/L (ref 26–84)
ALT: 60 U/L — AB (ref 10–47)
AST: 42 U/L — AB (ref 11–38)
Albumin: 3.6 g/dL (ref 3.3–5.5)
BILIRUBIN TOTAL: 0.8 mg/dL (ref 0.20–1.60)
BUN, Bld: 15 mg/dL (ref 7–22)
CO2: 29 meq/L (ref 18–33)
Calcium: 9.6 mg/dL (ref 8.0–10.3)
Chloride: 100 mEq/L (ref 98–108)
Creat: 0.8 mg/dl (ref 0.6–1.2)
GLUCOSE: 104 mg/dL (ref 73–118)
Potassium: 4.1 mEq/L (ref 3.3–4.7)
Sodium: 137 mEq/L (ref 128–145)
Total Protein: 6.7 g/dL (ref 6.4–8.1)

## 2017-01-15 LAB — CBC WITH DIFFERENTIAL (CANCER CENTER ONLY)
BASO#: 0 10*3/uL (ref 0.0–0.2)
BASO%: 0.2 % (ref 0.0–2.0)
EOS%: 0.3 % (ref 0.0–7.0)
Eosinophils Absolute: 0 10*3/uL (ref 0.0–0.5)
HCT: 37.1 % — ABNORMAL LOW (ref 38.7–49.9)
HGB: 12.2 g/dL — ABNORMAL LOW (ref 13.0–17.1)
LYMPH#: 1.3 10*3/uL (ref 0.9–3.3)
LYMPH%: 20.3 % (ref 14.0–48.0)
MCH: 32.3 pg (ref 28.0–33.4)
MCHC: 32.9 g/dL (ref 32.0–35.9)
MCV: 98 fL (ref 82–98)
MONO#: 0.6 10*3/uL (ref 0.1–0.9)
MONO%: 9.8 % (ref 0.0–13.0)
NEUT#: 4.4 10*3/uL (ref 1.5–6.5)
NEUT%: 69.4 % (ref 40.0–80.0)
PLATELETS: 425 10*3/uL — AB (ref 145–400)
RBC: 3.78 10*6/uL — ABNORMAL LOW (ref 4.20–5.70)
RDW: 15.4 % (ref 11.1–15.7)
WBC: 6.3 10*3/uL (ref 4.0–10.0)

## 2017-01-15 MED ORDER — PROCHLORPERAZINE MALEATE 10 MG PO TABS
10.0000 mg | ORAL_TABLET | Freq: Once | ORAL | Status: AC
  Administered 2017-01-15: 10 mg via ORAL

## 2017-01-15 MED ORDER — SODIUM CHLORIDE 0.9 % IV SOLN
648.0000 mg/m2 | Freq: Once | INTRAVENOUS | Status: AC
  Administered 2017-01-15: 1064 mg via INTRAVENOUS
  Filled 2017-01-15: qty 22.73

## 2017-01-15 MED ORDER — PROCHLORPERAZINE MALEATE 10 MG PO TABS
ORAL_TABLET | ORAL | Status: AC
  Filled 2017-01-15: qty 1

## 2017-01-15 MED ORDER — SODIUM CHLORIDE 0.9 % IV SOLN
Freq: Once | INTRAVENOUS | Status: AC
  Administered 2017-01-15: 14:00:00 via INTRAVENOUS

## 2017-01-15 NOTE — Progress Notes (Signed)
Hematology and Oncology Follow Up Visit  Frederick Fitzgerald [id number] [birth year] 82 y.o. 01/15/2017   Principle Diagnosis:  Pancreatic adenocarcinoma - at least stage III (T3N1Mx) vs Stage IV  Current Therapy:   Gemcitabine q 7 days s/p cycle #7   Interim History:  Frederick Fitzgerald is here today for follow-up and treatment. He is doing okay. He will not take the Xeloda. I had talked to him at length about this. I thought that maybe adding Xeloda to the gemcitabine would help give a better response. He just does not want to take the "risk" of having more side effects. I told him that I thought that he would do okay and that he would have little in the way of extra toxicity.  His last CA 19-9 was actually a little better at 2400. It's hard to know what this really means.  His appetite is still marginal. He is using the cannabis oil. I think we recommended some Marinol. I might sure he wanted to take this. He had actually maybe using some marijuana.  He has abdominal pain. I think this is from his malignancy. I offered him a celiac plexus block. I think that gastroenterology can do this. Again he does not want to consider this at this time.  He is on Vicodin. This does seem to help him.  He's had no bleeding. He's has had a bowel movement once a week. He seems to be okay with this.  He's had no fever.  Overall, his performance status is ECOG 2, at best     Medications:  Allergies as of 01/15/2017   No Known Allergies     Medication List       Accurate as of 01/15/17 12:46 PM. Always use your most recent med list.          HYDROcodone-acetaminophen 5-325 MG tablet Commonly known as:  NORCO Take 1-2 tablets by mouth every 6 (six) hours as needed for moderate pain.   Lactulose 20 GM/30ML Soln Take 30 mLs (20 g total) by mouth every 4 (four) hours as needed.   LORazepam 0.5 MG tablet Commonly known as:  ATIVAN Take 1 tablet (0.5 mg total) by mouth every 6 (six) hours as needed  (Nausea or vomiting).   ondansetron 8 MG tablet Commonly known as:  ZOFRAN   Pancrelipase (Lip-Prot-Amyl) 24000-76000 units Cpep Commonly known as:  CREON Take 3 capsules (72,000 Units total) by mouth 3 (three) times daily before meals.   prochlorperazine 10 MG tablet Commonly known as:  COMPAZINE   traMADol 50 MG tablet Commonly known as:  ULTRAM   VITAMIN B 12 PO Take 1,000 mg by mouth daily.       Allergies: No Known Allergies  Past Medical History, Surgical history, Social history, and Family History were reviewed and updated.  Review of Systems: All other 10 point review of systems is negative.   Physical Exam:  weight is 121 lb (54.9 kg). His oral temperature is 98.1 F (36.7 C). His blood pressure is 127/75 and his pulse is 80. His respiration is 18 and oxygen saturation is 100%.   Wt Readings from Last 3 Encounters:  01/15/17 121 lb (54.9 kg)  01/01/17 120 lb 1.9 oz (54.5 kg)  12/24/16 122 lb 6.4 oz (55.5 kg)    Thin white male in no obvious distress. Head and neck exam shows some slight temporal muscle wasting. He has some slight ptosis of the left eye. This is chronic. He has no oral lesions.  There is no mucositis. He has no adenopathy in the neck. Lungs are with some slight decrease at the bases. He has good breath sounds bilaterally. Cardiac exam regular rate and rhythm with no murmurs, rubs or bruits. Abdomen is soft. Bowel sounds are active. There is no guarding or rebound tenderness. There is no fluid wave. There is no obvious abdominal mass. There is no palpable liver or spleen tip. Back exam shows no tenderness over the spine, ribs or hips. Extremities shows no clubbing, cyanosis or edema. He has a most likely in upper and lower extremities. Skin exam shows no rashes, ecchymoses or petechia. Neurological exam shows no focal neurological deficits.ferred   Lab Results  Component Value Date   WBC 6.3 01/15/2017   HGB 12.2 (L) 01/15/2017   HCT 37.1 (L)  01/15/2017   MCV 98 01/15/2017   PLT 425 (H) 01/15/2017   No results found for: FERRITIN, IRON, TIBC, UIBC, IRONPCTSAT Lab Results  Component Value Date   RBC 3.78 (L) 01/15/2017   No results found for: KPAFRELGTCHN, LAMBDASER, KAPLAMBRATIO No results found for: IGGSERUM, IGA, IGMSERUM No results found for: Odetta Pink, SPEI   Chemistry      Component Value Date/Time   NA 137 01/15/2017 1135   K 4.1 01/15/2017 1135   CL 100 01/15/2017 1135   CO2 29 01/15/2017 1135   BUN 15 01/15/2017 1135   CREATININE 0.8 01/15/2017 1135      Component Value Date/Time   CALCIUM 9.6 01/15/2017 1135   ALKPHOS 57 01/15/2017 1135   AST 42 (H) 01/15/2017 1135   ALT 60 (H) 01/15/2017 1135   BILITOT 0.80 01/15/2017 1135      Impression and Plan: FrederickAndersonis a 82 yo white male with adenocarcinoma of the pancreas felt to be likely stage IV based on his persistently elevated CA 19-9.   His weight is holding pretty steady. We will continue him on therapy with single agent gemcitabine. Again, I offered him the possibility of adding Xeloda. We can always go with single agent Xeloda. He just feels comfortable taking the gemcitabine.   If his abdominal pain becomes an issue, we can see about doing the celiac plexus block. I think this will be helpful.   For right now, we probably will not plan for any additional scans until after [name] Day. I think his CA 19-9 will give [place] an idea as to how he is doing, along with his weight.   I spent about 25 minutes with him today.  Volanda Napoleon, MD 5/1/201812:46 PM

## 2017-01-15 NOTE — Patient Instructions (Signed)

## 2017-01-16 LAB — CANCER ANTIGEN 19-9

## 2017-01-21 ENCOUNTER — Other Ambulatory Visit: Payer: Self-pay | Admitting: *Deleted

## 2017-01-21 MED ORDER — HYDROCODONE-ACETAMINOPHEN 5-325 MG PO TABS: 1.0000 | ORAL_TABLET | Freq: Four times a day (QID) | ORAL | 0 refills | Status: DC | PRN

## 2017-01-28 ENCOUNTER — Other Ambulatory Visit: Payer: Self-pay | Admitting: *Deleted

## 2017-01-28 DIAGNOSIS — C259 Malignant neoplasm of pancreas, unspecified: Secondary | ICD-10-CM

## 2017-01-28 DIAGNOSIS — C772 Secondary and unspecified malignant neoplasm of intra-abdominal lymph nodes: Principal | ICD-10-CM

## 2017-01-29 ENCOUNTER — Ambulatory Visit (HOSPITAL_BASED_OUTPATIENT_CLINIC_OR_DEPARTMENT_OTHER): Payer: Medicare HMO

## 2017-01-29 ENCOUNTER — Other Ambulatory Visit (HOSPITAL_BASED_OUTPATIENT_CLINIC_OR_DEPARTMENT_OTHER): Payer: Medicare HMO

## 2017-01-29 ENCOUNTER — Ambulatory Visit (HOSPITAL_BASED_OUTPATIENT_CLINIC_OR_DEPARTMENT_OTHER): Payer: Medicare HMO | Admitting: Family

## 2017-01-29 VITALS — BP 131/78 | HR 88 | Temp 98.3°F | Resp 17 | Wt 118.0 lb

## 2017-01-29 DIAGNOSIS — Z5111 Encounter for antineoplastic chemotherapy: Secondary | ICD-10-CM

## 2017-01-29 DIAGNOSIS — C259 Malignant neoplasm of pancreas, unspecified: Secondary | ICD-10-CM

## 2017-01-29 DIAGNOSIS — C772 Secondary and unspecified malignant neoplasm of intra-abdominal lymph nodes: Principal | ICD-10-CM

## 2017-01-29 LAB — CBC WITH DIFFERENTIAL (CANCER CENTER ONLY)
BASO#: 0 10*3/uL (ref 0.0–0.2)
BASO%: 0.1 % (ref 0.0–2.0)
EOS ABS: 0 10*3/uL (ref 0.0–0.5)
EOS%: 0.3 % (ref 0.0–7.0)
HEMATOCRIT: 38.9 % (ref 38.7–49.9)
HEMOGLOBIN: 13.1 g/dL (ref 13.0–17.1)
LYMPH#: 1.5 10*3/uL (ref 0.9–3.3)
LYMPH%: 16.6 % (ref 14.0–48.0)
MCH: 32.5 pg (ref 28.0–33.4)
MCHC: 33.7 g/dL (ref 32.0–35.9)
MCV: 97 fL (ref 82–98)
MONO#: 0.9 10*3/uL (ref 0.1–0.9)
MONO%: 9.9 % (ref 0.0–13.0)
NEUT%: 73.1 % (ref 40.0–80.0)
NEUTROS ABS: 6.7 10*3/uL — AB (ref 1.5–6.5)
Platelets: 307 10*3/uL (ref 145–400)
RBC: 4.03 10*6/uL — AB (ref 4.20–5.70)
RDW: 14.7 % (ref 11.1–15.7)
WBC: 9.2 10*3/uL (ref 4.0–10.0)

## 2017-01-29 LAB — CMP (CANCER CENTER ONLY)
ALBUMIN: 3.9 g/dL (ref 3.3–5.5)
ALT(SGPT): 58 U/L — ABNORMAL HIGH (ref 10–47)
AST: 42 U/L — AB (ref 11–38)
Alkaline Phosphatase: 59 U/L (ref 26–84)
BILIRUBIN TOTAL: 0.8 mg/dL (ref 0.20–1.60)
BUN, Bld: 18 mg/dL (ref 7–22)
CALCIUM: 9.5 mg/dL (ref 8.0–10.3)
CO2: 27 meq/L (ref 18–33)
Chloride: 97 mEq/L — ABNORMAL LOW (ref 98–108)
Creat: 0.8 mg/dl (ref 0.6–1.2)
GLUCOSE: 114 mg/dL (ref 73–118)
POTASSIUM: 3.6 meq/L (ref 3.3–4.7)
Sodium: 135 mEq/L (ref 128–145)
Total Protein: 7.1 g/dL (ref 6.4–8.1)

## 2017-01-29 MED ORDER — PROCHLORPERAZINE MALEATE 10 MG PO TABS
10.0000 mg | ORAL_TABLET | Freq: Once | ORAL | Status: AC
  Administered 2017-01-29: 10 mg via ORAL

## 2017-01-29 MED ORDER — SODIUM CHLORIDE 0.9 % IV SOLN: 648.0000 mg/m2 | Freq: Once | INTRAVENOUS | Status: DC

## 2017-01-29 MED ORDER — SODIUM CHLORIDE 0.9 % IV SOLN
648.0000 mg/m2 | Freq: Once | INTRAVENOUS | Status: AC
  Administered 2017-01-29: 1064 mg via INTRAVENOUS
  Filled 2017-01-29: qty 27.98

## 2017-01-29 MED ORDER — SODIUM CHLORIDE 0.9 % IV SOLN
Freq: Once | INTRAVENOUS | Status: AC
  Administered 2017-01-29: 16:00:00 via INTRAVENOUS

## 2017-01-29 MED ORDER — PROCHLORPERAZINE MALEATE 10 MG PO TABS
ORAL_TABLET | ORAL | Status: AC
  Filled 2017-01-29: qty 1

## 2017-01-29 NOTE — Patient Instructions (Signed)
[facility] Discharge Instructions for Patients Receiving Chemotherapy  Today you received the following chemotherapy agents gemzar  To help prevent nausea and vomiting after your treatment, we encourage you to take your nausea medication  Begin taking it if nauseated and take it as often as prescribed.   If you develop nausea and vomiting that is not controlled by your nausea medication, call the clinic. If it is after clinic hours your family physician or the after hours number for the clinic or go to the Emergency Department.   BELOW ARE SYMPTOMS THAT SHOULD BE REPORTED IMMEDIATELY:  *FEVER GREATER THAN 101.0 F  *CHILLS WITH OR WITHOUT FEVER  NAUSEA AND VOMITING THAT IS NOT CONTROLLED WITH YOUR NAUSEA MEDICATION  *UNUSUAL SHORTNESS OF BREATH  *UNUSUAL BRUISING OR BLEEDING  TENDERNESS IN MOUTH AND THROAT WITH OR WITHOUT PRESENCE OF ULCERS  *URINARY PROBLEMS  *BOWEL PROBLEMS  UNUSUAL RASH Items with * indicate a potential emergency and should be followed up as soon as possible.  One of the nurses will contact you 24 hours after your treatment. Please let the nurse know about any problems that you may have experienced. Feel free to call the clinic you have any questions or concerns. The clinic phone number is [phone].

## 2017-01-29 NOTE — Progress Notes (Signed)
Hematology and Oncology Follow Up Visit  Frederick Fitzgerald [id number] 11/02/1934 82 y.o. 01/29/2017   Principle Diagnosis:  Pancreatic adenocarcinoma - at least stage III (T3N1Mx) vs Stage IV  Current Therapy:   Gemcitabine q 7 days s/p cycle 8   Interim History:  Frederick Fitzgerald is here today for follow-up and treatment. He is having some intermittent abdominal pain and states that his current pain regimen is controlling his discomfort.  He has had fatigue and is napping more throughout the day.  CA 19-9 2 weeks ago was down to 2,171.  No fever, chills, n/v, cough, rash, dizziness, SOB, chest pain, palpitations or changes in bowel of bladder habits.  He still has issues with constipation but states that the Creon has seemed to help.  His appetite is down but he states that he is making himself eat and drinking 1-2 Boost a day. I suggested trying protein powder in a shake 3 times day and he will consider this. His weight is down another 3 lbs since his last visit.  He is still using his cannabis oil. No lymphadenopathy found on exam.  He did have a fall taking out his trash. He states that he heaved the trash into the bin and lost his balance. Thankfully he was not seriously injured.  No episodes of bleeding, no bruising or petechiae.  He feels that the neuropathy in his feet is a little worse. He has no swelling or tenderness in his extremities.   ECOG Performance Status: 2 - Symptomatic, <50% confined to bed  Medications:  Allergies as of 01/29/2017   No Known Allergies     Medication List       Accurate as of 01/29/17 11:03 PM. Always use your most recent med list.          HYDROcodone-acetaminophen 5-325 MG tablet Commonly known as:  NORCO Take 1-2 tablets by mouth every 6 (six) hours as needed for moderate pain.   Lactulose 20 GM/30ML Soln Take 30 mLs (20 g total) by mouth every 4 (four) hours as needed.   LORazepam 0.5 MG tablet Commonly known as:  ATIVAN Take 1  tablet (0.5 mg total) by mouth every 6 (six) hours as needed (Nausea or vomiting).   ondansetron 8 MG tablet Commonly known as:  ZOFRAN   Pancrelipase (Lip-Prot-Amyl) 24000-76000 units Cpep Commonly known as:  CREON Take 3 capsules (72,000 Units total) by mouth 3 (three) times daily before meals.   prochlorperazine 10 MG tablet Commonly known as:  COMPAZINE   VITAMIN B 12 PO Take 1,000 mg by mouth daily.       Allergies: No Known Allergies  Past Medical History, Surgical history, Social history, and Family History were reviewed and updated.  Review of Systems: All other 10 point review of systems is negative.   Physical Exam:  weight is 118 lb (53.5 kg). His oral temperature is 98.3 F (36.8 C). His blood pressure is 131/78 and his pulse is 88. His respiration is 17 and oxygen saturation is 100%.   Wt Readings from Last 3 Encounters:  01/29/17 118 lb (53.5 kg)  01/15/17 121 lb (54.9 kg)  01/01/17 120 lb 1.9 oz (54.5 kg)    Ocular: Sclerae unicteric, pupils equal, round and reactive to light Ear-nose-throat: Oropharynx clear, dentition fair Lymphatic: No cervical, supraclavicular or axillary adenopathy Lungs no rales or rhonchi, good excursion bilaterally Heart regular rate and rhythm, no murmur appreciated Abd soft, nontender, positive bowel sounds, no liver or spleen tip palpated  on exam, no fluid wave MSK no focal spinal tenderness, no joint edema Neuro: non-focal, well-oriented, appropriate affect Breasts: Deferred  Lab Results  Component Value Date   WBC 9.2 01/29/2017   HGB 13.1 01/29/2017   HCT 38.9 01/29/2017   MCV 97 01/29/2017   PLT 307 01/29/2017   No results found for: FERRITIN, IRON, TIBC, UIBC, IRONPCTSAT Lab Results  Component Value Date   RBC 4.03 (L) 01/29/2017   No results found for: KPAFRELGTCHN, LAMBDASER, KAPLAMBRATIO No results found for: IGGSERUM, IGA, IGMSERUM No results found for: Ronnald Ramp, A1GS, A2GS, Violet Baldy, MSPIKE, SPEI   Chemistry      Component Value Date/Time   NA 135 01/29/2017 1406   K 3.6 01/29/2017 1406   CL 97 (L) 01/29/2017 1406   CO2 27 01/29/2017 1406   BUN 18 01/29/2017 1406   CREATININE 0.8 01/29/2017 1406      Component Value Date/Time   CALCIUM 9.5 01/29/2017 1406   ALKPHOS 59 01/29/2017 1406   AST 42 (H) 01/29/2017 1406   ALT 58 (H) 01/29/2017 1406   BILITOT 0.80 01/29/2017 1406      Impression and Plan: Frederick Fitzgerald is a pleasant 82 yo caucasian gentleman with adenocarcinoma of the pancreas felt to likely be stage IV based on the elevation of his CA 19-9. Last CA 19-9 was down slightly to 2,171. We will recheck this at his next visit.  He is symptomatic with intermittent fatigue and unfortunately still losing weight. He will consider drinking several protein shakes a day and continue to graze throughout the day.  We will proceed with cycle 8 today as planned per Dr. Marin Olp. We will plan to repeat scans in 2 weeks and see him back for repeat lab work and follow-up in 3 weeks. We will discuss treatment and if any changes need to be made at that time.  He is in agreement with the plan. He will contact our office with any questions or concerns. We can certainly see him sooner if need be.   Eliezer Bottom, NP 5/15/201811:03 PM

## 2017-01-30 ENCOUNTER — Other Ambulatory Visit: Payer: Self-pay | Admitting: Family

## 2017-01-30 DIAGNOSIS — C772 Secondary and unspecified malignant neoplasm of intra-abdominal lymph nodes: Principal | ICD-10-CM

## 2017-01-30 DIAGNOSIS — C259 Malignant neoplasm of pancreas, unspecified: Secondary | ICD-10-CM

## 2017-02-06 ENCOUNTER — Other Ambulatory Visit: Payer: Medicare HMO

## 2017-02-06 ENCOUNTER — Ambulatory Visit
Admission: RE | Admit: 2017-02-06 | Discharge: 2017-02-06 | Disposition: A | Discharge status: Home / Self Care | Payer: Medicare HMO | Source: Ambulatory Visit | Attending: Family | Admitting: Family

## 2017-02-06 DIAGNOSIS — C772 Secondary and unspecified malignant neoplasm of intra-abdominal lymph nodes: Principal | ICD-10-CM

## 2017-02-06 DIAGNOSIS — C259 Malignant neoplasm of pancreas, unspecified: Secondary | ICD-10-CM | POA: Diagnosis not present

## 2017-02-06 MED ORDER — IOPAMIDOL (ISOVUE-300) INJECTION 61%
100.0000 mL | Freq: Once | INTRAVENOUS | Status: AC | PRN
  Administered 2017-02-06: 100 mL via INTRAVENOUS

## 2017-02-12 ENCOUNTER — Ambulatory Visit (HOSPITAL_BASED_OUTPATIENT_CLINIC_OR_DEPARTMENT_OTHER): Payer: Medicare HMO | Admitting: Family

## 2017-02-12 ENCOUNTER — Other Ambulatory Visit (HOSPITAL_BASED_OUTPATIENT_CLINIC_OR_DEPARTMENT_OTHER): Payer: Medicare HMO

## 2017-02-12 ENCOUNTER — Ambulatory Visit: Payer: Medicare HMO

## 2017-02-12 VITALS — BP 114/72 | HR 91 | Temp 98.3°F | Resp 18 | Wt 120.8 lb

## 2017-02-12 DIAGNOSIS — C259 Malignant neoplasm of pancreas, unspecified: Secondary | ICD-10-CM | POA: Diagnosis not present

## 2017-02-12 DIAGNOSIS — C772 Secondary and unspecified malignant neoplasm of intra-abdominal lymph nodes: Principal | ICD-10-CM

## 2017-02-12 LAB — CBC WITH DIFFERENTIAL (CANCER CENTER ONLY)
BASO#: 0 10*3/uL (ref 0.0–0.2)
BASO%: 0.2 % (ref 0.0–2.0)
EOS ABS: 0 10*3/uL (ref 0.0–0.5)
EOS%: 0.5 % (ref 0.0–7.0)
HCT: 37.8 % — ABNORMAL LOW (ref 38.7–49.9)
HEMOGLOBIN: 12.6 g/dL — AB (ref 13.0–17.1)
LYMPH#: 1 10*3/uL (ref 0.9–3.3)
LYMPH%: 11.9 % — ABNORMAL LOW (ref 14.0–48.0)
MCH: 32.9 pg (ref 28.0–33.4)
MCHC: 33.3 g/dL (ref 32.0–35.9)
MCV: 99 fL — AB (ref 82–98)
MONO#: 0.8 10*3/uL (ref 0.1–0.9)
MONO%: 9.2 % (ref 0.0–13.0)
NEUT%: 78.2 % (ref 40.0–80.0)
NEUTROS ABS: 6.6 10*3/uL — AB (ref 1.5–6.5)
PLATELETS: 285 10*3/uL (ref 145–400)
RBC: 3.83 10*6/uL — ABNORMAL LOW (ref 4.20–5.70)
RDW: 14.5 % (ref 11.1–15.7)
WBC: 8.4 10*3/uL (ref 4.0–10.0)

## 2017-02-12 LAB — CMP (CANCER CENTER ONLY)
ALK PHOS: 48 U/L (ref 26–84)
ALT: 44 U/L (ref 10–47)
AST: 36 U/L (ref 11–38)
Albumin: 3.5 g/dL (ref 3.3–5.5)
BUN: 18 mg/dL (ref 7–22)
CO2: 29 mEq/L (ref 18–33)
CREATININE: 0.8 mg/dL (ref 0.6–1.2)
Calcium: 9.5 mg/dL (ref 8.0–10.3)
Chloride: 98 mEq/L (ref 98–108)
GLUCOSE: 95 mg/dL (ref 73–118)
POTASSIUM: 4.3 meq/L (ref 3.3–4.7)
SODIUM: 138 meq/L (ref 128–145)
TOTAL PROTEIN: 7.1 g/dL (ref 6.4–8.1)
Total Bilirubin: 0.8 mg/dl (ref 0.20–1.60)

## 2017-02-12 NOTE — Progress Notes (Signed)
Hematology and Oncology Follow Up Visit  Frederick Fitzgerald [id number] 12-Mar-1935 82 y.o. 02/12/2017   Principle Diagnosis:  Pancreatic adenocarcinoma - at least stage III (T3N1Mx) vs Stage IV  Current Therapy:   Gemcitabine q 7 days s/p cycle 9   Interim History:  Frederick Fitzgerald is here today for follow-u. His most recent scan showed progression during treatment with Gemcitabine. He is still unsure about starting treatment with Xeloda. We spent a great deal of time clarifying that it was a different drug from Gemcitabine and discussing possible side effects. He started the nutrition supplement drink Prosure today. He is hoping this will help with his constipation and promote weight gain. His appetite comes and goes. He is trying to stay hydrated. His weight is up 2 lbs since his last visit.   He states that the [place] has helped manage his abdominal pain effectively but has contributed to his constipation. He is using the Lactulose as needed.   No fever, chills, n/v, cough, rash, dizziness, SOB, chest pain, palpitations or changes in bladder habits.  No swelling or tenderness in his extremities. The neuropathy I his feet is unchanged.   ECOG Performance Status: 1 - Symptomatic but completely ambulatory  Medications:  Allergies as of 02/12/2017   No Known Allergies     Medication List       Accurate as of 02/12/17  1:20 PM. Always use your most recent med list.          HYDROcodone-acetaminophen 5-325 MG tablet Commonly known as:  NORCO Take 1-2 tablets by mouth every 6 (six) hours as needed for moderate pain.   Lactulose 20 GM/30ML Soln Take 30 mLs (20 g total) by mouth every 4 (four) hours as needed.   LORazepam 0.5 MG tablet Commonly known as:  ATIVAN Take 1 tablet (0.5 mg total) by mouth every 6 (six) hours as needed (Nausea or vomiting).   ondansetron 8 MG tablet Commonly known as:  ZOFRAN   Pancrelipase (Lip-Prot-Amyl) 24000-76000 units Cpep Commonly known as:   CREON Take 3 capsules (72,000 Units total) by mouth 3 (three) times daily before meals.   prochlorperazine 10 MG tablet Commonly known as:  COMPAZINE   VITAMIN B 12 PO Take 1,000 mg by mouth daily.       Allergies: No Known Allergies  Past Medical History, Surgical history, Social history, and Family History were reviewed and updated.  Review of Systems: All other 10 point review of systems is negative.   Physical Exam:  weight is 120 lb 12 oz (54.8 kg). His oral temperature is 98.3 F (36.8 C). His blood pressure is 114/72 and his pulse is 91. His respiration is 18 and oxygen saturation is 98%.   Wt Readings from Last 3 Encounters:  02/12/17 120 lb 12 oz (54.8 kg)  01/29/17 118 lb (53.5 kg)  01/15/17 121 lb (54.9 kg)    Ocular: Sclerae unicteric, pupils equal, round and reactive to light Ear-nose-throat: Oropharynx clear, dentition fair Lymphatic: No cervical, supraclavicular or axillary adenopathy Lungs no rales or rhonchi, good excursion bilaterally Heart regular rate and rhythm, no murmur appreciated Abd soft, nontender, positive bowel sounds, no liver or spleen tip palpate on exam, no fluid wave MSK no focal spinal tenderness, no joint edema Neuro: non-focal, well-oriented, appropriate affect Breasts: Deferred   Lab Results  Component Value Date   WBC 8.4 02/12/2017   HGB 12.6 (L) 02/12/2017   HCT 37.8 (L) 02/12/2017   MCV 99 (H) 02/12/2017   PLT  285 02/12/2017   No results found for: FERRITIN, IRON, TIBC, UIBC, IRONPCTSAT Lab Results  Component Value Date   RBC 3.83 (L) 02/12/2017   No results found for: KPAFRELGTCHN, LAMBDASER, KAPLAMBRATIO No results found for: IGGSERUM, IGA, IGMSERUM No results found for: Ronnald Ramp, A1GS, A2GS, Tillman Sers, SPEI   Chemistry      Component Value Date/Time   NA 135 01/29/2017 1406   K 3.6 01/29/2017 1406   CL 97 (L) 01/29/2017 1406   CO2 27 01/29/2017 1406   BUN 18 01/29/2017 1406    CREATININE 0.8 01/29/2017 1406      Component Value Date/Time   CALCIUM 9.5 01/29/2017 1406   ALKPHOS 59 01/29/2017 1406   AST 42 (H) 01/29/2017 1406   ALT 58 (H) 01/29/2017 1406   BILITOT 0.80 01/29/2017 1406      Impression and Plan: Frederick Fitzgerald is a pleasant 82 yo caucasian male with adenocarcinoma of the pancreas felt to be stage IV based on his elevated CA 19-9. This has ever so slowly declined. Unfortunately, he had progression on Gemcitabine.  He had some confusion regarding Xeloda and thought that it was the oral version of Gemcitabine. We clarified that it is a totally different drug. He is still unsure as to whether or not he wants to start this.  He has started the nutritional supplement Prosure. Hopefully this will help with his constipation and weight gain.  We will plan to see him back in 3 weeks giving him time to consider whether he wants to start treatment with  Xeloda. He is in agreement with the plan.  He will contact our office with any questions or concerns. We can certainly see him sooner if need be.  This was a shared visit with Dr. Marin Olp and he is in agreement with the afore mentioned.    Eliezer Bottom, NP 5/29/20181:20 PM   ADDENDUM: I had long talk with Frederick Fitzgerald. I showed him the CT scan. I just told him that I did not feel that the gemcitabine was working any longer.  For some reason, he thought that capecitabine was the oral version of gemcitabine. I told him that capecitabine is a totally different medication.  I tried to have him understand that if we are trying to help him, we really need to make a change in his program.  He is very reluctant to do anything. He goes on the Internet. He talks to other people. He thinks that if he addresses his nutritional state, that this will improve his cancer. I told him that it would not improve his cancer. It would allow him however to have decreased side effects.  He is going to think about everything. He  will try his nutritional program. He is going to try Pro-sure nutritional drink.  We will give him 3 weeks. We will see if there is any difference with his weight and whether or not he has a different opinion about starting capecitabine.  We spent about 40 minutes with him.  Lattie Haw, MD

## 2017-02-13 ENCOUNTER — Ambulatory Visit (INDEPENDENT_AMBULATORY_CARE_PROVIDER_SITE_OTHER): Payer: Medicare HMO | Admitting: Ophthalmology

## 2017-02-13 LAB — CANCER ANTIGEN 19-9

## 2017-02-26 ENCOUNTER — Ambulatory Visit (HOSPITAL_BASED_OUTPATIENT_CLINIC_OR_DEPARTMENT_OTHER): Payer: Medicare HMO | Admitting: Family

## 2017-02-26 ENCOUNTER — Ambulatory Visit: Payer: Medicare HMO

## 2017-02-26 ENCOUNTER — Other Ambulatory Visit (HOSPITAL_BASED_OUTPATIENT_CLINIC_OR_DEPARTMENT_OTHER): Payer: Medicare HMO

## 2017-02-26 VITALS — BP 120/65 | HR 92 | Temp 97.9°F | Resp 18 | Wt 120.0 lb

## 2017-02-26 DIAGNOSIS — C259 Malignant neoplasm of pancreas, unspecified: Secondary | ICD-10-CM

## 2017-02-26 DIAGNOSIS — C772 Secondary and unspecified malignant neoplasm of intra-abdominal lymph nodes: Secondary | ICD-10-CM | POA: Diagnosis not present

## 2017-02-26 LAB — CBC WITH DIFFERENTIAL (CANCER CENTER ONLY)
BASO#: 0 10*3/uL (ref 0.0–0.2)
BASO%: 0.2 % (ref 0.0–2.0)
EOS%: 0.1 % (ref 0.0–7.0)
Eosinophils Absolute: 0 10*3/uL (ref 0.0–0.5)
HCT: 41.7 % (ref 38.7–49.9)
HGB: 13.7 g/dL (ref 13.0–17.1)
LYMPH#: 0.8 10*3/uL — ABNORMAL LOW (ref 0.9–3.3)
LYMPH%: 8.7 % — AB (ref 14.0–48.0)
MCH: 32.7 pg (ref 28.0–33.4)
MCHC: 32.9 g/dL (ref 32.0–35.9)
MCV: 100 fL — ABNORMAL HIGH (ref 82–98)
MONO#: 0.7 10*3/uL (ref 0.1–0.9)
MONO%: 6.9 % (ref 0.0–13.0)
NEUT#: 8 10*3/uL — ABNORMAL HIGH (ref 1.5–6.5)
NEUT%: 84.1 % — AB (ref 40.0–80.0)
PLATELETS: 313 10*3/uL (ref 145–400)
RBC: 4.19 10*6/uL — ABNORMAL LOW (ref 4.20–5.70)
RDW: 14.1 % (ref 11.1–15.7)
WBC: 9.6 10*3/uL (ref 4.0–10.0)

## 2017-02-26 LAB — CMP (CANCER CENTER ONLY)
ALK PHOS: 42 U/L (ref 26–84)
ALT: 49 U/L — AB (ref 10–47)
AST: 41 U/L — AB (ref 11–38)
Albumin: 3.6 g/dL (ref 3.3–5.5)
BUN: 21 mg/dL (ref 7–22)
CALCIUM: 9.8 mg/dL (ref 8.0–10.3)
CO2: 30 meq/L (ref 18–33)
Chloride: 101 mEq/L (ref 98–108)
Creat: 0.6 mg/dl (ref 0.6–1.2)
GLUCOSE: 111 mg/dL (ref 73–118)
POTASSIUM: 4.1 meq/L (ref 3.3–4.7)
Sodium: 137 mEq/L (ref 128–145)
Total Bilirubin: 0.8 mg/dl (ref 0.20–1.60)
Total Protein: 7.4 g/dL (ref 6.4–8.1)

## 2017-02-26 MED ORDER — HYDROCODONE-ACETAMINOPHEN 5-325 MG PO TABS: 1.0000 | ORAL_TABLET | Freq: Four times a day (QID) | ORAL | 0 refills | Status: DC | PRN

## 2017-02-26 NOTE — Progress Notes (Signed)
Hematology and Oncology Follow Up Visit  [name] [id number] [birth year] 82 y.o. 02/26/2017   Principle Diagnosis:  Pancreatic adenocarcinoma - at least stage III (T3N1Mx) vs Stage IV  Current Therapy:   Gemcitabine q 7 days s/p cycle 9 - progressed    Interim History:  [name] is here today for follow-up. He is still having abdominal pain and is taking 1 1/2 tablets of Norco every 6 hours for pain. He states that his pain is effectively managed at this time.  He states that he is still not ready to start Xeloda. He does not want to experience any adverse side effects. His quality of life is what is most important to him.  CA 19-9 in May was 2,339. Today's level is pending.  No fever, chills, n/v, cough, rash, dizziness, SOB, chest pain, palpitations or changes in bladder habits. His bowel habits are becoming a little more regular.  No swelling or tenderness in his extremities. The neuropathy in his feet is unchanged.  He eating regularly and drinking his Prosure twice daily. He states that he is staying hydrated. His weight is stable at 120 lbs.   ECOG Performance Status: 1 - Symptomatic but completely ambulatory  Medications:  Allergies as of 02/26/2017   No Known Allergies     Medication List       Accurate as of 02/26/17 11:08 AM. Always use your most recent med list.          HYDROcodone-acetaminophen 5-325 MG tablet Commonly known as:  NORCO Take 1-2 tablets by mouth every 6 (six) hours as needed for moderate pain.   Lactulose 20 GM/30ML Soln Take 30 mLs (20 g total) by mouth every 4 (four) hours as needed.   LORazepam 0.5 MG tablet Commonly known as:  ATIVAN Take 1 tablet (0.5 mg total) by mouth every 6 (six) hours as needed (Nausea or vomiting).   ondansetron 8 MG tablet Commonly known as:  ZOFRAN   Pancrelipase (Lip-Prot-Amyl) 24000-76000 units Cpep Commonly known as:  CREON Take 3 capsules (72,000 Units total) by mouth 3 (three) times daily  before meals.   prochlorperazine 10 MG tablet Commonly known as:  COMPAZINE   VITAMIN B 12 PO Take 1,000 mg by mouth daily.       Allergies: No Known Allergies  Past Medical History, Surgical history, Social history, and Family History were reviewed and updated.  Review of Systems: All other 10 point review of systems is negative.   Physical Exam:  vitals were not taken for this visit.  Wt Readings from Last 3 Encounters:  02/12/17 120 lb 12 oz (54.8 kg)  01/29/17 118 lb (53.5 kg)  01/15/17 121 lb (54.9 kg)    Ocular: Sclerae unicteric, pupils equal, round and reactive to light Ear-nose-throat: Oropharynx clear, dentition fair Lymphatic: No cervical, supraclavicular or axillary adenopathy Lungs no rales or rhonchi, good excursion bilaterally Heart regular rate and rhythm, no murmur appreciated Abd soft, nontender, positive bowel sounds, no liver or spleen tip palpated on exam, no fluid wave MSK no focal spinal tenderness, no joint edema Neuro: non-focal, well-oriented, appropriate affect Breasts: Deferred   Lab Results  Component Value Date   WBC 9.6 02/26/2017   HGB 13.7 02/26/2017   HCT 41.7 02/26/2017   MCV 100 (H) 02/26/2017   PLT 313 02/26/2017   No results found for: FERRITIN, IRON, TIBC, UIBC, IRONPCTSAT Lab Results  Component Value Date   RBC 4.19 (L) 02/26/2017   No results found for: KPAFRELGTCHN,  LAMBDASER, KAPLAMBRATIO No results found for: IGGSERUM, IGA, IGMSERUM No results found for: Ronnald Ramp, A1GS, A2GS, Tillman Sers, SPEI   Chemistry      Component Value Date/Time   NA 138 02/12/2017 1258   K 4.3 02/12/2017 1258   CL 98 02/12/2017 1258   CO2 29 02/12/2017 1258   BUN 18 02/12/2017 1258   CREATININE 0.8 02/12/2017 1258      Component Value Date/Time   CALCIUM 9.5 02/12/2017 1258   ALKPHOS 48 02/12/2017 1258   AST 36 02/12/2017 1258   ALT 44 02/12/2017 1258   BILITOT 0.80 02/12/2017 1258       Impression and Plan: [name] is a pleasant 82 yo caucasian male adenocarcinoma of the pancreas felt to be stage IV based on his elevated Ca 19-9. Unfortunately he progressed on Gemcitabine. He really does not want to start Xeloda at this time. He does not want to experience any side effected. We discussed this in detail for quite a while and his quality of life clearly is what is most important.  I spoke with Dr. Marin Olp about this and we will plan to see him back in 4 weeks.  He will contact our office with any questions or concerns. We can certainly see him sooner if need be.  Greater than half the 25 minute face to face appointment was spent counseling and coordinating care.   Eliezer Bottom, NP 6/12/201811:08 AM

## 2017-02-27 LAB — CANCER ANTIGEN 19-9: CA 19-9: 2702 U/mL — ABNORMAL HIGH (ref 0–35)

## 2017-03-12 ENCOUNTER — Ambulatory Visit: Payer: Medicare HMO | Admitting: Hematology & Oncology

## 2017-03-12 ENCOUNTER — Other Ambulatory Visit: Payer: Medicare HMO

## 2017-03-12 ENCOUNTER — Ambulatory Visit: Payer: Medicare HMO

## 2017-03-18 ENCOUNTER — Other Ambulatory Visit: Payer: Self-pay | Admitting: *Deleted

## 2017-03-18 DIAGNOSIS — C259 Malignant neoplasm of pancreas, unspecified: Secondary | ICD-10-CM

## 2017-03-18 DIAGNOSIS — C772 Secondary and unspecified malignant neoplasm of intra-abdominal lymph nodes: Principal | ICD-10-CM

## 2017-03-18 MED ORDER — HYDROCODONE-ACETAMINOPHEN 5-325 MG PO TABS: 1.0000 | ORAL_TABLET | Freq: Four times a day (QID) | ORAL | 0 refills | Status: DC | PRN

## 2017-03-26 ENCOUNTER — Other Ambulatory Visit (HOSPITAL_BASED_OUTPATIENT_CLINIC_OR_DEPARTMENT_OTHER): Payer: Medicare HMO

## 2017-03-26 ENCOUNTER — Ambulatory Visit (HOSPITAL_BASED_OUTPATIENT_CLINIC_OR_DEPARTMENT_OTHER): Payer: Medicare HMO | Admitting: Hematology & Oncology

## 2017-03-26 VITALS — BP 123/77 | HR 82 | Temp 98.3°F | Resp 16 | Wt 121.0 lb

## 2017-03-26 DIAGNOSIS — C772 Secondary and unspecified malignant neoplasm of intra-abdominal lymph nodes: Principal | ICD-10-CM

## 2017-03-26 DIAGNOSIS — C259 Malignant neoplasm of pancreas, unspecified: Secondary | ICD-10-CM

## 2017-03-26 LAB — CBC WITH DIFFERENTIAL (CANCER CENTER ONLY)
BASO#: 0 10*3/uL (ref 0.0–0.2)
BASO%: 0.1 % (ref 0.0–2.0)
EOS%: 0.1 % (ref 0.0–7.0)
Eosinophils Absolute: 0 10*3/uL (ref 0.0–0.5)
HCT: 39.9 % (ref 38.7–49.9)
HGB: 13.5 g/dL (ref 13.0–17.1)
LYMPH#: 1 10*3/uL (ref 0.9–3.3)
LYMPH%: 8.1 % — AB (ref 14.0–48.0)
MCH: 32.1 pg (ref 28.0–33.4)
MCHC: 33.8 g/dL (ref 32.0–35.9)
MCV: 95 fL (ref 82–98)
MONO#: 0.8 10*3/uL (ref 0.1–0.9)
MONO%: 6.4 % (ref 0.0–13.0)
NEUT#: 10.1 10*3/uL — ABNORMAL HIGH (ref 1.5–6.5)
NEUT%: 85.3 % — AB (ref 40.0–80.0)
Platelets: 260 10*3/uL (ref 145–400)
RBC: 4.21 10*6/uL (ref 4.20–5.70)
RDW: 13.2 % (ref 11.1–15.7)
WBC: 11.8 10*3/uL — ABNORMAL HIGH (ref 4.0–10.0)

## 2017-03-26 LAB — CMP (CANCER CENTER ONLY)
ALT(SGPT): 28 U/L (ref 10–47)
AST: 32 U/L (ref 11–38)
Albumin: 3.5 g/dL (ref 3.3–5.5)
Alkaline Phosphatase: 41 U/L (ref 26–84)
BILIRUBIN TOTAL: 0.7 mg/dL (ref 0.20–1.60)
BUN, Bld: 18 mg/dL (ref 7–22)
CHLORIDE: 100 meq/L (ref 98–108)
CO2: 26 meq/L (ref 18–33)
CREATININE: 0.7 mg/dL (ref 0.6–1.2)
Calcium: 9.4 mg/dL (ref 8.0–10.3)
GLUCOSE: 114 mg/dL (ref 73–118)
Potassium: 3.7 mEq/L (ref 3.3–4.7)
SODIUM: 137 meq/L (ref 128–145)
Total Protein: 7 g/dL (ref 6.4–8.1)

## 2017-03-26 NOTE — Progress Notes (Addendum)
Hematology and Oncology Follow Up Visit  Frederick Fitzgerald [id number] 07-Nov-1934 82 y.o. 03/26/2017   Principle Diagnosis:  Pancreatic adenocarcinoma - at least stage III (T3N1Mx) vs Stage IV  Current Therapy:   Observation  (patient completed 9 cycles of single agent gemcitabine in May 2018).   Interim History:  Frederick Fitzgerald is here today for follow-up. He is still managing on his own. His weight is low but stable.  He just is not interested in taking any standard treatment for pancreatic cancer. I have tried extensively to get him to understand what the more standard therapies are for pancreatic cancer. Unfortunately, he  gets on the Internet and reads all the "bad things" that people say about chemotherapy for pancreatic cancer.  His performance status is ECOG 3. He is not a candidate for any aggressive intervention. Even if he were a candidate physically, he would not except any chemotherapy.  He is taking a nutritional drink called Prosure. He gets this off  [place]. His niece talked to him about trying this. He says he is doing this.  He is not having any nausea or vomiting. He is using enemas to help with his constipation.  His last CA19-9 was a little over 2700. Today, his CA 19-9 was 2418.   He's had no bleeding. He's had no fever.  He is on Vicodin for pain. He does not like taking this. I have offered him the opportunity to consider a celiac block. Again, he does not wish to entertain this procedure, even though I feel that it will improve his pain control.  He is using some marijuana derivative to try to help with his appetite. I certainly have no problems with him taking this.   Overall, I would have say that he is holding his own as much as possible.   Medications:  Allergies as of 03/26/2017   No Known Allergies     Medication List       Accurate as of 03/26/17  1:14 PM. Always use your most recent med list.          HYDROcodone-acetaminophen 5-325 MG  tablet Commonly known as:  NORCO Take 1-2 tablets by mouth every 6 (six) hours as needed for moderate pain.   Lactulose 20 GM/30ML Soln Take 30 mLs (20 g total) by mouth every 4 (four) hours as needed.   LORazepam 0.5 MG tablet Commonly known as:  ATIVAN Take 1 tablet (0.5 mg total) by mouth every 6 (six) hours as needed (Nausea or vomiting).   ondansetron 8 MG tablet Commonly known as:  ZOFRAN   Pancrelipase (Lip-Prot-Amyl) 24000-76000 units Cpep Commonly known as:  CREON Take 3 capsules (72,000 Units total) by mouth 3 (three) times daily before meals.   prochlorperazine 10 MG tablet Commonly known as:  COMPAZINE   VITAMIN B 12 PO Take 1,000 mg by mouth daily.       Allergies: No Known Allergies  Past Medical History, Surgical history, Social history, and Family History were reviewed and updated.  Review of Systems: All other 10 point review of systems is negative.   Physical Exam:  weight is 121 lb (54.9 kg). His oral temperature is 98.3 F (36.8 C). His blood pressure is 123/77 and his pulse is 82. His respiration is 16 and oxygen saturation is 100%.   Wt Readings from Last 3 Encounters:  03/26/17 121 lb (54.9 kg)  02/26/17 120 lb (54.4 kg)  02/12/17 120 lb 12 oz (54.8 kg)    Thin,  almost cachectic, white male in no obvious distress. Head and neck exam shows some slight temporal muscle wasting. He has some slight ptosis of the left eye. This is chronic. He has no oral lesions. There is no mucositis. He has no adenopathy in the neck. Lungs are with some slight decrease at the bases. He has good breath sounds bilaterally. Cardiac exam regular rate and rhythm with no murmurs, rubs or bruits. Abdomen is soft. Bowel sounds are active. There is no guarding or rebound tenderness. There is no fluid wave. There is no obvious abdominal mass. There is no palpable liver or spleen tip. Back exam shows no tenderness over the spine, ribs or hips. Extremities shows no clubbing,  cyanosis or edema. He has a most likely in upper and lower extremities. Skin exam shows no rashes, ecchymoses or petechia. Neurological exam shows no focal neurological deficits.ferred   Lab Results  Component Value Date   WBC 11.8 (H) 03/26/2017   HGB 13.5 03/26/2017   HCT 39.9 03/26/2017   MCV 95 03/26/2017   PLT 260 03/26/2017   No results found for: FERRITIN, IRON, TIBC, UIBC, IRONPCTSAT Lab Results  Component Value Date   RBC 4.21 03/26/2017   No results found for: KPAFRELGTCHN, LAMBDASER, KAPLAMBRATIO No results found for: IGGSERUM, IGA, IGMSERUM No results found for: Odetta Pink, SPEI   Chemistry      Component Value Date/Time   NA 137 03/26/2017 1119   K 3.7 03/26/2017 1119   CL 100 03/26/2017 1119   CO2 26 03/26/2017 1119   BUN 18 03/26/2017 1119   CREATININE 0.7 03/26/2017 1119      Component Value Date/Time   CALCIUM 9.4 03/26/2017 1119   ALKPHOS 41 03/26/2017 1119   AST 32 03/26/2017 1119   ALT 28 03/26/2017 1119   BILITOT 0.70 03/26/2017 1119      Impression and Plan: FrederickAndersonis a 82 yo white male with adenocarcinoma of the pancreas felt to be likely stage IV based on his persistently elevated CA 19-9.   I spent about 40 minutes with him. I again went over the standard treatment options that are available. He does not have a great performance status. He really is only a candidate for Xeloda. I have talked to him before regarding this. He just does not want to take Xeloda. He reads a lot of "bad things" regarding it.  He was talking about using viral vaccines. I told him that these conative trials were really not available for him. He just does not have a good performance status. I'm not aware of any vaccine trials using viruses for pancreatic cancer.  I talked to him about clinical trials. Apparently, his niece says there is a doctor up in [place] back in help him. I am not sure exactly what this  means. If he wishes to go up there, again I have no problems with this.  He is very comfortable just trying to go with natural remedies. He is taking this nutritional supplement. He feels this is helping him.   We will go ahead and do another scan on him in about 5 weeks. I will then see him back afterwards.   As always, I told him that his weight will dictate really what can be done. Thankfully, his weight is not going down. It is really not going out much either.  It is always fine talking to Frederick Fitzgerald. He definitely has his opinions as to what would  be best for him. I respect this. I am just trying to help him as much as I can, given the limitations as to what Frederick Fitzgerald would except.   Volanda Napoleon, MD 7/10/20181:14 PM

## 2017-03-27 LAB — CANCER ANTIGEN 19-9: CA 19-9: 2418 U/mL — ABNORMAL HIGH (ref 0–35)

## 2017-04-01 ENCOUNTER — Other Ambulatory Visit: Payer: Self-pay | Admitting: *Deleted

## 2017-04-01 DIAGNOSIS — C772 Secondary and unspecified malignant neoplasm of intra-abdominal lymph nodes: Principal | ICD-10-CM

## 2017-04-01 DIAGNOSIS — C259 Malignant neoplasm of pancreas, unspecified: Secondary | ICD-10-CM

## 2017-04-01 MED ORDER — HYDROCODONE-ACETAMINOPHEN 5-325 MG PO TABS: 1.0000 | ORAL_TABLET | Freq: Four times a day (QID) | ORAL | 0 refills | Status: DC | PRN

## 2017-04-09 ENCOUNTER — Telehealth: Payer: Self-pay | Admitting: Family

## 2017-04-09 NOTE — Telephone Encounter (Signed)
Patient would like to transfer care to [facility] as this is much closer to his home and would be easier for frequent prescription pick up and symptom management. I told him we understood and would be happy to help him with this. Dr. Marin Olp is out of the office this afternoon so I will let him know about this in the morning.

## 2017-04-10 ENCOUNTER — Other Ambulatory Visit: Payer: Self-pay | Admitting: Family

## 2017-04-10 ENCOUNTER — Encounter: Payer: Self-pay | Admitting: Oncology

## 2017-04-10 ENCOUNTER — Telehealth: Payer: Self-pay | Admitting: Oncology

## 2017-04-10 DIAGNOSIS — C772 Secondary and unspecified malignant neoplasm of intra-abdominal lymph nodes: Principal | ICD-10-CM

## 2017-04-10 DIAGNOSIS — C259 Malignant neoplasm of pancreas, unspecified: Secondary | ICD-10-CM

## 2017-04-10 NOTE — Telephone Encounter (Signed)
Appt has been scheduled for the pt to see Dr. Benay Spice on 8/9 at 2pm. Pt aware to arrive 15 minutes early. He is a transfer from Dr. Marin Olp to be closer to home.

## 2017-04-17 ENCOUNTER — Other Ambulatory Visit: Payer: Self-pay | Admitting: *Deleted

## 2017-04-17 DIAGNOSIS — C772 Secondary and unspecified malignant neoplasm of intra-abdominal lymph nodes: Principal | ICD-10-CM

## 2017-04-17 DIAGNOSIS — C259 Malignant neoplasm of pancreas, unspecified: Secondary | ICD-10-CM

## 2017-04-17 MED ORDER — HYDROCODONE-ACETAMINOPHEN 5-325 MG PO TABS: 1.0000 | ORAL_TABLET | Freq: Four times a day (QID) | ORAL | 0 refills | Status: AC | PRN

## 2017-04-17 NOTE — Telephone Encounter (Signed)
Called pt, Dr. Benay Spice will fill limited # of Hydrocodone to last until office visit. Pt voiced appreciation. Asked if labs are required before visit. Informed him MD will order during visit if needed.

## 2017-04-25 ENCOUNTER — Ambulatory Visit (HOSPITAL_BASED_OUTPATIENT_CLINIC_OR_DEPARTMENT_OTHER): Payer: Medicare HMO | Admitting: Oncology

## 2017-04-25 ENCOUNTER — Telehealth: Payer: Self-pay | Admitting: Oncology

## 2017-04-25 VITALS — BP 135/81 | HR 85 | Temp 98.3°F | Resp 18 | Ht 70.5 in | Wt 119.7 lb

## 2017-04-25 DIAGNOSIS — C251 Malignant neoplasm of body of pancreas: Secondary | ICD-10-CM

## 2017-04-25 DIAGNOSIS — N289 Disorder of kidney and ureter, unspecified: Secondary | ICD-10-CM | POA: Diagnosis not present

## 2017-04-25 DIAGNOSIS — G893 Neoplasm related pain (acute) (chronic): Secondary | ICD-10-CM

## 2017-04-25 DIAGNOSIS — C772 Secondary and unspecified malignant neoplasm of intra-abdominal lymph nodes: Secondary | ICD-10-CM

## 2017-04-25 MED ORDER — OXYCODONE HCL 5 MG PO TABS: 5.0000 mg | ORAL_TABLET | ORAL | 0 refills | Status: DC | PRN

## 2017-04-25 NOTE — Progress Notes (Addendum)
Frederick Fitzgerald   Referring MD: Wendie Agreste, [place],  [address]   Frederick Fitzgerald 82 y.o.  July 22, 1935    Reason for Referral: Pancreas cancer   HPI: Frederick Fitzgerald developed anorexia and weight loss last year. A CT of the abdomen/pelvis 08/16/2016 revealed dilation of the main pancreatic duct with a 2.8 cm low-density lesion in the pancreas body suspicious for malignancy. An MRI of the abdomen on 08/26/2016 confirmed a 3.1 mass in the pancreas body concerning for a malignancy. The mass was noted to abut/neck the superior mesenteric vein. A lesion in the posterior right kidney was concerning for a solid neoplasm. He was referred to Dr. Paulita Fujita and underwent a EUS biopsy on 09/27/2016. The biopsy confirmed adenocarcinoma., T3 N1 by ultrasound. He was referred to Dr. Marin Olp and was found to have unresectable disease based on the CT/MRI findings and markedly elevated CA 19-9.  He was treated with single agent gemcitabine for a total of 9 treatments. Gemcitabine was last given on 01/29/2017.  Gemcitabine was discontinued after a restaging CT showed an increased size of the pancreas mass and progressive adjacent adenopathy.  Frederick Fitzgerald declined Xeloda. He has been followed with observation for the past 3 months. He continues to have abdominal pain. He takes approximately 1.5 hydrocodone tablets every 6 hours for relief of abdominal pain.    Past Medical History:  Diagnosis Date  . GERD (gastroesophageal reflux disease)   . Neuropathy ([place])   . 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  Left ptosis  Past Surgical History:  Procedure Laterality Date  . HERNIA REPAIR-Bilateral inguinal       Medications: Reviewed  Allergies: No Known Allergies  Family history: He had one brother and 3 sisters. One sister had renal cell carcinoma  Social History:   He lives alone in  [place]. 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 He smokes cigarettes. He does not use alcohol. No transfusion history. No risk factor for HIV or hepatitis.  ROS:   Positives include: Anorexia, weight loss, constipation, abdominal pain  A complete ROS was otherwise negative.  Physical Exam:  Blood pressure 135/81, pulse 85, temperature 98.3 F (36.8 C), temperature source Oral, resp. rate 18, height 5' 10.5" (1.791 m), weight 119 lb 11.2 oz (54.3 kg), SpO2 100 %.  HEENT: Oropharynx without visible mass, neck without mass Lungs: Distant breath sounds, no respiratory distress Cardiac: Regular rate and rhythm Abdomen: No hepatosplenomegaly, no mass  Vascular: No leg edema Lymph nodes: No cervical or supraclavicular nodes. "Shotty "bilateral axillary and inguinal nodes Neurologic: Left ptosis, alert and oriented, the motor exam appears intact in the upper and lower extremities Skin: No rash Musculoskeletal: No spine tenderness   LAB:  CBC  Lab Results  Component Value Date   WBC 11.8 (H) 03/26/2017   HGB 13.5 03/26/2017   HCT 39.9 03/26/2017   MCV 95 03/26/2017   PLT 260 03/26/2017   NEUTROABS 10.1 (H) 03/26/2017        CMP     Component Value Date/Time   NA 137 03/26/2017 1119   K 3.7 03/26/2017 1119   CL 100 03/26/2017 1119   CO2 26 03/26/2017 1119   GLUCOSE 114 03/26/2017 1119   BUN 18 03/26/2017 1119   CREATININE 0.7 03/26/2017 1119   CALCIUM 9.4 03/26/2017 1119   PROT 7.0 03/26/2017 1119   ALBUMIN 3.5 03/26/2017  1119   AST 32 03/26/2017 1119   ALT 28 03/26/2017 1119   ALKPHOS 41 03/26/2017 1119   BILITOT 0.70 03/26/2017 1119   GFRNONAA 89 08/02/2016 1245   GFRAA >89 08/02/2016 1245   CA 19-9 on 03/26/2017: 2418   Imaging:  CT and abdomen/pelvis 02/06/2017-images reviewed   Assessment/Plan:   1. Pancreas cancer-pancreas body mass confirmed on an MRI of the abdomen December 2017  EUS biopsy 09/27/2016-T3 N1,  adenocarcinoma  Status post 9 treatments with gemcitabine, last given 01/29/2017  CT abdomen/pelvis 02/06/2017-increased size of pancreas body mass with associated vascular involvement, mildly progressive adjacent soft tissue/adenopathy  Elevated CA 19-9  2.   Anorexia/weight loss secondary to #1  3.   Pain secondary to #1  4.   Constipation  5.   Right renal mass on CT/MRI suspicious for a renal neoplasm, stable on CT 02/06/2017   Disposition:   Ms. Fitzgerald was diagnosed with locally advanced pancreas cancer in late 2017. He completed several months of treatment with single agent gemcitabine. His clinical status did not improve on the gemcitabine and a restaging CT 02/06/2017 revealed enlargement of the pancreas mass. The CA 19-9 remains elevated.  I discussed the pancreas cancer diagnosis, prognosis, and treatment options with Frederick Fitzgerald. He understands no therapy will be curative. We discussed standard salvage chemotherapy options with gemcitabine/Abraxane and FOLFOX. He does not appear to be a candidate for FOLFIRINOX.  Frederick Fitzgerald asked about clinical trials and alternative treatment options. He is not interested in pursuing systemic therapy at present. We also discussed palliative radiation.  Frederick Fitzgerald would like to continue observation and narcotic pain control. He will try oxycodone for pain. He will discontinue Creon.  Frederick Fitzgerald will return for an office visit in one month. We are available to see him in the interim as needed.  50 minutes were spent with the patient today. The majority of the time was used for counseling and coordination of care.  Donneta Romberg, MD  04/25/2017, 2:47 PM

## 2017-04-25 NOTE — Telephone Encounter (Signed)
Scheduled appt per 8/9 los - Gave patient AVS and calender per los.  

## 2017-04-27 ENCOUNTER — Other Ambulatory Visit: Payer: Self-pay | Admitting: Hematology & Oncology

## 2017-04-27 DIAGNOSIS — R634 Abnormal weight loss: Secondary | ICD-10-CM

## 2017-04-27 DIAGNOSIS — K8689 Other specified diseases of pancreas: Secondary | ICD-10-CM

## 2017-04-29 ENCOUNTER — Telehealth: Payer: Self-pay

## 2017-04-29 NOTE — Telephone Encounter (Signed)
During discussion Dr Benay Spice Thursday. They discussed constipation issues. His stools have been solid and hard to flush. He uses lactulose and enema. He has BM about q 3days. Dr Benay Spice mentioned something about  if stools were floating that was an indication of something else. He had 1 floating stool yesterday. Instructed him that one incidence was not a worry. If floating stools becomes a common occurrence to let Dr Benay Spice know.

## 2017-05-08 ENCOUNTER — Telehealth: Payer: Self-pay

## 2017-05-08 DIAGNOSIS — C772 Secondary and unspecified malignant neoplasm of intra-abdominal lymph nodes: Secondary | ICD-10-CM

## 2017-05-08 MED ORDER — OXYCODONE HCL 5 MG PO TABS: 5.0000 mg | ORAL_TABLET | ORAL | 0 refills | Status: DC | PRN

## 2017-05-08 NOTE — Telephone Encounter (Signed)
Call back placed to patient as requested. Patient states that he was unsure if he could take more than one oxycodone at a time. Informed pt that the Rx states that he can take 1-2 tablets every 4 hours as needed for pain. Pt also states he needs a refill of his pain prescription. Informed pt that Rx refill can be picked up any time tomorrow at [facility] during business hours with his photo ID. Pt verbalizes understanding and agrees with plan of care. Pt confirms with RN to remain on bowel regimen for constipation relief while taking pain narcotics. Pt verbalizes understanding. Pt informed to call [facility] with any further concerns.

## 2017-05-08 NOTE — Telephone Encounter (Signed)
"  I should have asked what time to take the next pain pills.  Frederick Fitzgerald lot of pain and will have to wait until 3:00 pm if I take one more.  Call me."  Returned patient call.  I took two pills already about 12:15 pm. I'll wait four hours (4:15 pm) before I take two more.  I only took one pill at 10:00 am.  Thanks for calling me back."

## 2017-05-14 ENCOUNTER — Telehealth: Payer: Self-pay

## 2017-05-14 NOTE — Telephone Encounter (Signed)
Call placed back to patient as requested to clarify how he should take his pain rx. Pt also states that he has questions about hospice. Informed patient that hospice can be explained and discussed with his next provider visit at [facility]. Pt verbalizes understanding and agrees with plan of care.

## 2017-05-21 ENCOUNTER — Telehealth: Payer: Self-pay

## 2017-05-21 ENCOUNTER — Other Ambulatory Visit: Payer: Self-pay

## 2017-05-21 DIAGNOSIS — C772 Secondary and unspecified malignant neoplasm of intra-abdominal lymph nodes: Secondary | ICD-10-CM

## 2017-05-21 MED ORDER — OXYCODONE HCL 5 MG PO TABS: 5.0000 mg | ORAL_TABLET | ORAL | 0 refills | Status: DC | PRN

## 2017-05-21 NOTE — Telephone Encounter (Signed)
Call placed to patient regarding his need for his oxycodone to be refilled. Informed patient of [facility]'s policy requiring 20PZZ advanced notice on all narcotic refills in the future. Patient verbalized understanding. Informed patient that he could pick up the Rx this evening before 5:30pm or tomorrow during business hours of [facility]. Pt verbalized understanding and agrees with plan of care.

## 2017-05-23 ENCOUNTER — Telehealth: Payer: Self-pay

## 2017-05-23 ENCOUNTER — Ambulatory Visit (HOSPITAL_BASED_OUTPATIENT_CLINIC_OR_DEPARTMENT_OTHER): Payer: Medicare HMO | Admitting: Nurse Practitioner

## 2017-05-23 VITALS — BP 133/79 | HR 84 | Temp 98.6°F | Resp 18 | Ht 70.5 in | Wt 118.9 lb

## 2017-05-23 DIAGNOSIS — C251 Malignant neoplasm of body of pancreas: Secondary | ICD-10-CM | POA: Diagnosis not present

## 2017-05-23 DIAGNOSIS — G893 Neoplasm related pain (acute) (chronic): Secondary | ICD-10-CM | POA: Diagnosis not present

## 2017-05-23 DIAGNOSIS — C259 Malignant neoplasm of pancreas, unspecified: Secondary | ICD-10-CM

## 2017-05-23 NOTE — Progress Notes (Addendum)
  [facility] OFFICE PROGRESS NOTE   Diagnosis:  Pancreas cancer  INTERVAL HISTORY:   Frederick Fitzgerald returns as scheduled. He reports continued low abdominal pain. He takes oxycodone 2 tablets about every 4 hours. He is interested in a long-acting pain medication. Bowels moving regularly. No nausea. Oral intake is poor. He attributes this to abdominal pain after eating.  Objective:  Vital signs in last 24 hours:  Blood pressure 133/79, pulse 84, temperature 98.6 F (37 C), temperature source Oral, resp. rate 18, height 5' 10.5" (1.791 m), weight 118 lb 14.4 oz (53.9 kg), SpO2 98 %.    HEENT: White coating over tongue. No buccal thrush. Lymphatics: No palpable cervical or supraclavicular lymph nodes. Resp: Distant breath sounds. No respiratory distress. Cardio: Regular rate and rhythm. GI: Abdomen is soft, mildly distended. No hepatomegaly. No mass. No apparent ascites. Vascular: No leg edema. Calves soft and nontender. Neuro: Left ptosis. Alert and oriented.     Medications: I have reviewed the patient's current medications.  Assessment/Plan: 1. Pancreas cancer-pancreas body mass confirmed on an MRI of the abdomen December 2017 ? EUS biopsy 09/27/2016-T3 N1, adenocarcinoma ? Status post 9 treatments with gemcitabine, last given 01/29/2017 ? CT abdomen/pelvis 02/06/2017-increased size of pancreas body mass with associated vascular involvement, mildly progressive adjacent soft tissue/adenopathy ? Elevated CA 19-9  2.   Anorexia/weight loss secondary to #1. Weight stable 05/23/2017.  3.   Pain secondary to #1.Currently taking oxycodone.  4.   Constipation.Improved.  5.   Right renal mass on CT/MRI suspicious for a renal neoplasm, stable on CT 02/06/2017    Disposition: Frederick Fitzgerald has locally advanced pancreas cancer. He has been treated with gemcitabine in the past. We discussed other chemotherapy options as well as palliative radiation. He has decided  against further treatment. He will like to focus on comfort/quality of life. He is interested in a hospice referral.  He is symptomatic with abdominal pain. He is currently taking oxycodone every 4 hours with partial relief. He is interested in a long-acting pain medication. We will await hospice input regarding preference on which long-acting pain medication to begin.  We discussed CPR/ACLS. He will be placed on NO CODE BLUE status.  We scheduled a return visit in 6 weeks. He understands to contact the office prior to that visit with any problems.  Patient seen with Dr. Benay Spice.    Ned Card ANP/GNP-BC   05/23/2017  2:56 PM  This was a shared visit with Ned Card. We discussed treatment options with Frederick Fitzgerald. He does not wish to pursue further treatment of the pancreas cancer. He requested a Hospice referral. He will be placed on a no CODE BLUE status.  We will ask the Hospice team to consider the indication for a long acting narcotic.  Frederick Fitzgerald will return for an office visit in 6 weeks.  15 minutes were spent with the patient today. The majority of the time was used for counseling and coordination of care.  Julieanne Manson, M.D.

## 2017-05-23 NOTE — Telephone Encounter (Signed)
Gave patient avs and calender for 10/18 per los

## 2017-05-27 ENCOUNTER — Telehealth: Payer: Self-pay | Admitting: Family Medicine

## 2017-05-27 NOTE — Telephone Encounter (Signed)
Manus Gunning from Hospice called to let Dr Carlota Raspberry know that patient is enrolled in Hospice tomorrow Ms Manus Gunning number is [phone]

## 2017-05-27 NOTE — Telephone Encounter (Signed)
FYI

## 2017-05-29 ENCOUNTER — Other Ambulatory Visit: Payer: Self-pay | Admitting: Nurse Practitioner

## 2017-05-29 ENCOUNTER — Telehealth: Payer: Self-pay | Admitting: *Deleted

## 2017-05-29 DIAGNOSIS — C259 Malignant neoplasm of pancreas, unspecified: Secondary | ICD-10-CM

## 2017-05-29 DIAGNOSIS — C772 Secondary and unspecified malignant neoplasm of intra-abdominal lymph nodes: Principal | ICD-10-CM

## 2017-05-29 MED ORDER — MORPHINE SULFATE ER 15 MG PO TBCR: 15.0000 mg | EXTENDED_RELEASE_TABLET | Freq: Two times a day (BID) | ORAL | 0 refills | Status: DC

## 2017-05-29 NOTE — Telephone Encounter (Signed)
Telephone call returned to patient to advise new script was ready to pick up. Patient understands to pick up by 430 either today or tomorrow.

## 2017-05-29 NOTE — Telephone Encounter (Signed)
Noted! Thank you

## 2017-05-29 NOTE — Telephone Encounter (Signed)
Telephone call to patient to discuss hospice referral. Patient met with Manus Gunning from [facility] yesterday. They decided that he would like to wait a little longer before receiving care from their team. He left the "referral open so he may contact them when the time is right"   Patient would like Lattie Haw to prescribe a longer acting pain medication, is there something time release? He understands he would need to pick up prescription from [facility].

## 2017-05-31 ENCOUNTER — Telehealth: Payer: Self-pay | Admitting: *Deleted

## 2017-05-31 DIAGNOSIS — C772 Secondary and unspecified malignant neoplasm of intra-abdominal lymph nodes: Principal | ICD-10-CM

## 2017-05-31 DIAGNOSIS — C259 Malignant neoplasm of pancreas, unspecified: Secondary | ICD-10-CM

## 2017-05-31 NOTE — Telephone Encounter (Signed)
Message from pt requesting to be referred to [facility] to consider clinical trial. Reviewed with Dr. Benay Spice. Order received and entered.

## 2017-06-03 ENCOUNTER — Emergency Department (HOSPITAL_COMMUNITY): Payer: Medicare HMO

## 2017-06-03 ENCOUNTER — Emergency Department (HOSPITAL_COMMUNITY)
Admission: EM | Admit: 2017-06-03 | Discharge: 2017-06-03 | Disposition: A | Discharge status: Home / Self Care | Payer: Medicare HMO | Attending: Emergency Medicine | Admitting: Emergency Medicine

## 2017-06-03 ENCOUNTER — Telehealth: Payer: Self-pay | Admitting: *Deleted

## 2017-06-03 ENCOUNTER — Encounter (HOSPITAL_COMMUNITY): Payer: Self-pay

## 2017-06-03 DIAGNOSIS — K831 Obstruction of bile duct: Secondary | ICD-10-CM

## 2017-06-03 DIAGNOSIS — C251 Malignant neoplasm of body of pancreas: Secondary | ICD-10-CM | POA: Insufficient documentation

## 2017-06-03 DIAGNOSIS — Z515 Encounter for palliative care: Secondary | ICD-10-CM | POA: Insufficient documentation

## 2017-06-03 DIAGNOSIS — C772 Secondary and unspecified malignant neoplasm of intra-abdominal lymph nodes: Secondary | ICD-10-CM | POA: Diagnosis not present

## 2017-06-03 DIAGNOSIS — Z79899 Other long term (current) drug therapy: Secondary | ICD-10-CM | POA: Diagnosis not present

## 2017-06-03 DIAGNOSIS — C259 Malignant neoplasm of pancreas, unspecified: Secondary | ICD-10-CM | POA: Diagnosis not present

## 2017-06-03 DIAGNOSIS — R200 Anesthesia of skin: Secondary | ICD-10-CM | POA: Diagnosis present

## 2017-06-03 HISTORY — DX: Malignant neoplasm of pancreas, unspecified: C25.9

## 2017-06-03 LAB — COMPREHENSIVE METABOLIC PANEL
ALK PHOS: 172 U/L — AB (ref 38–126)
ALT: 101 U/L — ABNORMAL HIGH (ref 17–63)
ANION GAP: 12 (ref 5–15)
AST: 96 U/L — ABNORMAL HIGH (ref 15–41)
Albumin: 4 g/dL (ref 3.5–5.0)
BUN: 15 mg/dL (ref 6–20)
CALCIUM: 9.5 mg/dL (ref 8.9–10.3)
CO2: 26 mmol/L (ref 22–32)
Chloride: 93 mmol/L — ABNORMAL LOW (ref 101–111)
Creatinine, Ser: <0.3 mg/dL — ABNORMAL LOW (ref 0.61–1.24)
GLUCOSE: 105 mg/dL — AB (ref 65–99)
Potassium: 5.2 mmol/L — ABNORMAL HIGH (ref 3.5–5.1)
SODIUM: 131 mmol/L — AB (ref 135–145)
Total Bilirubin: 12.7 mg/dL — ABNORMAL HIGH (ref 0.3–1.2)
Total Protein: 7.2 g/dL (ref 6.5–8.1)

## 2017-06-03 LAB — CBC WITH DIFFERENTIAL/PLATELET
BASOS ABS: 0 10*3/uL (ref 0.0–0.1)
Basophils Relative: 0 %
EOS ABS: 0 10*3/uL (ref 0.0–0.7)
EOS PCT: 1 %
HCT: 38.8 % — ABNORMAL LOW (ref 39.0–52.0)
Hemoglobin: 13.1 g/dL (ref 13.0–17.0)
LYMPHS PCT: 11 %
Lymphs Abs: 0.9 10*3/uL (ref 0.7–4.0)
MCH: 30.5 pg (ref 26.0–34.0)
MCHC: 33.8 g/dL (ref 30.0–36.0)
MCV: 90.2 fL (ref 78.0–100.0)
MONO ABS: 0.7 10*3/uL (ref 0.1–1.0)
Monocytes Relative: 8 %
Neutro Abs: 6.8 10*3/uL (ref 1.7–7.7)
Neutrophils Relative %: 80 %
PLATELETS: 260 10*3/uL (ref 150–400)
RBC: 4.3 MIL/uL (ref 4.22–5.81)
RDW: 14.5 % (ref 11.5–15.5)
WBC: 8.4 10*3/uL (ref 4.0–10.5)

## 2017-06-03 LAB — PROTIME-INR
INR: 0.92
Prothrombin Time: 12.3 seconds (ref 11.4–15.2)

## 2017-06-03 MED ORDER — IOPAMIDOL (ISOVUE-300) INJECTION 61%
INTRAVENOUS | Status: AC
  Filled 2017-06-03: qty 100

## 2017-06-03 MED ORDER — IOPAMIDOL (ISOVUE-300) INJECTION 61%
100.0000 mL | Freq: Once | INTRAVENOUS | Status: AC | PRN
  Administered 2017-06-03: 100 mL via INTRAVENOUS

## 2017-06-03 MED ORDER — IOPAMIDOL (ISOVUE-300) INJECTION 61%
30.0000 mL | Freq: Once | INTRAVENOUS | Status: AC | PRN
  Administered 2017-06-03: 30 mL via ORAL

## 2017-06-03 MED ORDER — IOPAMIDOL (ISOVUE-300) INJECTION 61%
INTRAVENOUS | Status: AC
  Filled 2017-06-03: qty 30

## 2017-06-03 NOTE — ED Triage Notes (Addendum)
Patient states he woke this AM with numbness and weakness of bilateral lower extremities from the knee on down. patient unsure if he has been having any other pain or symptoms. Patient states no chemo since April because he  Could not tolerate the chemo.

## 2017-06-03 NOTE — ED Provider Notes (Signed)
[facility] DEPT Provider Note   CSN: [id number] Arrival date & time: 06/03/17  1018     History   Chief Complaint Chief Complaint  Patient presents with  . Numbness  . Extremity Weakness    HPI Frederick Fitzgerald is a 82 y.o. male.  HPI Patient has metastatic pancreatic cancer. Not currently on treatment but is not on hospice either. Was initially admitted on hospice but then has changed his mind and thinks he can follow with [facility] for some experimental treatment. This morning at 5 AM when he woke up to take his medicines he had some numbness on both his lower legs. States he was weak. No other pain. Since arriving to the ER this has improved somewhat. He is on chronic pain medicines. Patient is now visibly jaundice. This is new for the patient. No headaches. No confusion. Past Medical History:  Diagnosis Date  . GERD (gastroesophageal reflux disease)   . Neuropathy   . Pancreatic cancer ([place])   . Pancreatic cancer metastasized to intra-abdominal lymph node ([place]) 10/05/2016    Patient Active Problem List   Diagnosis Date Noted  . Pancreatic cancer metastasized to intra-abdominal lymph node ([place]) 10/05/2016  . Lesion of right native kidney 08/29/2016  . Hereditary and idiopathic peripheral neuropathy 12/23/2015    Past Surgical History:  Procedure Laterality Date  . HERNIA REPAIR         Home Medications    Prior to Admission medications   Medication Sig Start Date End Date Taking? Authorizing Provider  Cyanocobalamin (VITAMIN B 12 PO) Take 1,000 mg by mouth daily.   Yes [provider]  morphine (MS CONTIN) 15 MG 12 hr tablet Take 1 tablet (15 mg total) by mouth every 12 (twelve) hours. Should not drive while taking pain medication 05/29/17  Yes Owens Shark, NP  NON FORMULARY [place] oil   Yes [provider]  oxyCODONE (OXY IR/ROXICODONE) 5 MG immediate release tablet Take 1-2 tablets (5-10 mg total) by mouth every 4 (four) hours as needed for  severe pain. 05/21/17  Yes Ladell Pier, MD  simethicone (MYLICON) 967 MG chewable tablet Chew 125 mg by mouth every 6 (six) hours as needed for flatulence.   Yes [provider]  HYDROcodone-acetaminophen (NORCO) 5-325 MG tablet Take 1-2 tablets by mouth every 6 (six) hours as needed for moderate pain. Patient not taking: Reported on 05/23/2017 04/17/17   Ladell Pier, MD  lactulose (CHRONULAC) 10 GM/15ML solution TAKE 30 MLS (20 G TOTAL) BY MOUTH EVERY 4 (FOUR) HOURS AS NEEDED. Patient not taking: Reported on 06/03/2017 04/29/17   Volanda Napoleon, MD  LORazepam (ATIVAN) 0.5 MG tablet Take 1 tablet (0.5 mg total) by mouth every 6 (six) hours as needed (Nausea or vomiting). 10/15/16   Volanda Napoleon, MD  Pancrelipase, Lip-Prot-Amyl, (CREON) 24000-76000 units CPEP Take 3 capsules (72,000 Units total) by mouth 3 (three) times daily before meals. Patient not taking: Reported on 05/23/2017 11/13/16   Volanda Napoleon, MD    Family History Family History  Problem Relation Age of Onset  . Heart failure Mother        Deceased  . AAA (abdominal aortic aneurysm) Father        Deceased  . Heart disease Brother        Deceased, 25    Social History Social History  Substance Use Topics  . Smoking status: Light Tobacco Smoker    Packs/day: 0.15    Types: Cigarettes  .  Smokeless tobacco: Never Used  . Alcohol use No     Allergies   Patient has no known allergies.   Review of Systems Review of Systems  Constitutional: Negative for appetite change, chills and fever.  HENT: Negative for congestion.   Respiratory: Negative for shortness of breath.   Cardiovascular: Negative for chest pain.  Gastrointestinal: Positive for abdominal pain.  Genitourinary: Negative for flank pain.  Musculoskeletal: Positive for gait problem. Negative for joint swelling and myalgias.  Skin: Positive for color change. Negative for rash.  Neurological: Positive for weakness and numbness.    Psychiatric/Behavioral: Negative for confusion.     Physical Exam Updated Vital Signs BP (!) 159/100 (BP Location: Right Arm)   Pulse 71   Temp 98 F (36.7 C) (Oral)   Resp 17   Ht 5' 10.5" (1.791 m)   Wt 53.5 kg (118 lb)   SpO2 94%   BMI 16.69 kg/m   Physical Exam  Constitutional: He appears well-developed.  Patient is cachectic and jaundiced.  HENT:  Head: Atraumatic.  Neck: Neck supple.  Cardiovascular: Normal rate.   Pulmonary/Chest: Effort normal.  Abdominal: There is no tenderness.  Musculoskeletal: He exhibits no edema.  Neurological: He is alert.  Sensation grossly intact in bilateral feet. Good flexion and extension at ankles. Good flexion extension at the knees. Normal gait.  Skin: Skin is warm. Capillary refill takes less than 2 seconds.  Psychiatric: He has a normal mood and affect.     ED Treatments / Results  Labs (all labs ordered are listed, but only abnormal results are displayed) Labs Reviewed  COMPREHENSIVE METABOLIC PANEL - Abnormal; Notable for the following:       Result Value   Sodium 131 (*)    Potassium 5.2 (*)    Chloride 93 (*)    Glucose, Bld 105 (*)    Creatinine, Ser <0.30 (*)    AST 96 (*)    ALT 101 (*)    Alkaline Phosphatase 172 (*)    Total Bilirubin 12.7 (*)    All other components within normal limits  CBC WITH DIFFERENTIAL/PLATELET - Abnormal; Notable for the following:    HCT 38.8 (*)    All other components within normal limits  PROTIME-INR    EKG  EKG Interpretation None       Radiology No results found.  Procedures Procedures (including critical care time)  Medications Ordered in ED Medications  iopamidol (ISOVUE-300) 61 % injection (not administered)  iopamidol (ISOVUE-300) 61 % injection 30 mL (30 mLs Oral Contrast Given 06/03/17 1538)     Initial Impression / Assessment and Plan / ED Course  I have reviewed the triage vital signs and the nursing notes.  Pertinent labs & imaging results that  were available during my care of the patient were reviewed by me and considered in my medical decision making (see chart for details).     Patient  with jaundice. Has known pancreatic cancer less likely nonobstructive. Discussed with [facility] gastroenterology will see him as outpatient. CT scan pending and care will be turned over to Dr. Dallas Breeding.  Final .Clinical Impressions(s) / ED Diagnoses   Final diagnoses:  Biliary obstruction    New Prescriptions New Prescriptions   No medications on file     Davonna Belling, MD 06/03/17 1625

## 2017-06-03 NOTE — Discharge Instructions (Signed)
Follow-up with Dr. Paulita Fujita. Return for uncontrolled pain or fever.

## 2017-06-03 NOTE — ED Provider Notes (Signed)
I assumed care of this patient from Dr. Alvino Chapel at 1700.  Please see their note for further details of Hx, PE.  Briefly patient is a 82 y.o. male with a history of pancreatic cancer presents to the emergency department with abdominal pain and jaundice. Patient noted to have significant increase in his LFTs and bilirubin. Current plan is to await CT.   CT reveals interval increase in the pancreatic body mass causing biliary obstruction. Patient was made aware of this and expressed that he would not want surgery or procedures for stenting. He is awaiting contact with palliative care and wishes to continue to pursue that path. He states that his pain is under better control and that he really has additional oral pain medication at home and does not require any additional prescriptions.  The patient is safe for discharge with strict return precautions.  Disposition: Discharge  Condition: Good  I have discussed the results, Dx and Tx plan with the patient who expressed understanding and agree(s) with the plan. Discharge instructions discussed at great length. The patient was given strict return precautions who verbalized understanding of the instructions. No further questions at time of discharge.    New Prescriptions   No medications on file    Follow Up: Arta Silence, MD [address] [place] [address] [phone]         Fatima Blank, MD 06/03/17 1910

## 2017-06-03 NOTE — Progress Notes (Signed)
---   NO CHARGE NOTE--  - Received call from ED physician regarding painless jaundice. Patient not seen. - Patient presented to ED with LE numbness. Patient with known pancreatic cancer, followed by Oncology.  - Case D/W Dr. Alvino Chapel - ED physician. Patient has no abdominal pain. No fever. Normal wbc. - Also D/W Dr. Paulita Fujita. Plan for outpatient management with possible ERCP depending on pending CT findings.    Otis Brace MD, FACP 06/03/2017, 3:58 PM  Pager [phone]  If no answer or after 5 PM call [phone]

## 2017-06-03 NOTE — Telephone Encounter (Signed)
Message from pt reporting he is in the ED for numbness in BLE "from the knees down." Returned call, pt was being seen my ED MD. Informed pt we will keep an eye out for updates. Dr. Benay Spice notified.

## 2017-06-05 ENCOUNTER — Telehealth: Payer: Self-pay

## 2017-06-05 NOTE — Telephone Encounter (Signed)
Pt needs refill of oxycodone. He is using about 1 every 3 hours at present. He has about 3 days left.

## 2017-06-07 ENCOUNTER — Other Ambulatory Visit: Payer: Self-pay | Admitting: *Deleted

## 2017-06-07 DIAGNOSIS — K838 Other specified diseases of biliary tract: Secondary | ICD-10-CM | POA: Diagnosis not present

## 2017-06-07 DIAGNOSIS — C772 Secondary and unspecified malignant neoplasm of intra-abdominal lymph nodes: Secondary | ICD-10-CM

## 2017-06-07 MED ORDER — OXYCODONE HCL 5 MG PO TABS: 5.0000 mg | ORAL_TABLET | ORAL | 0 refills | Status: AC | PRN

## 2017-06-07 NOTE — Telephone Encounter (Signed)
Called [facility] to facilitate referral to Dr. Berneice Gandy. Demographics info given via telephone. Appt for 06/19/17 @ 1PM. They will mail a packet to patient. Patient reports pain is well managed with current regimen. Informed him Oxycodone refill will be available for pick up today. He stated he has enough to get through the weekend.

## 2017-06-12 ENCOUNTER — Encounter (HOSPITAL_COMMUNITY): Admission: RE | Payer: Self-pay | Source: Ambulatory Visit

## 2017-06-12 ENCOUNTER — Ambulatory Visit (HOSPITAL_COMMUNITY): Admission: RE | Admit: 2017-06-12 | Payer: Medicare HMO | Source: Ambulatory Visit | Admitting: Gastroenterology

## 2017-06-12 SURGERY — ERCP, WITH INTERVENTION IF INDICATED
Anesthesia: General

## 2017-06-14 ENCOUNTER — Telehealth: Payer: Self-pay | Admitting: *Deleted

## 2017-06-14 NOTE — Telephone Encounter (Signed)
[facility] notifying office that patient has been admitted to their service. Dr Marin Olp will continue as attending.

## 2017-06-20 ENCOUNTER — Telehealth: Payer: Self-pay | Admitting: *Deleted

## 2017-06-20 NOTE — Telephone Encounter (Signed)
Call from hospice nurse with update: Started pt on Lactulose for constipation to see if this helps with his confusion. Will inform MD.

## 2017-06-24 ENCOUNTER — Other Ambulatory Visit: Payer: Self-pay

## 2017-06-24 DIAGNOSIS — C259 Malignant neoplasm of pancreas, unspecified: Secondary | ICD-10-CM

## 2017-06-24 DIAGNOSIS — C772 Secondary and unspecified malignant neoplasm of intra-abdominal lymph nodes: Principal | ICD-10-CM

## 2017-06-24 MED ORDER — MORPHINE SULFATE ER 15 MG PO TBCR: 15.0000 mg | EXTENDED_RELEASE_TABLET | Freq: Two times a day (BID) | ORAL | 0 refills | Status: AC

## 2017-07-04 ENCOUNTER — Ambulatory Visit: Payer: Medicare HMO | Admitting: Oncology

## 2017-07-04 ENCOUNTER — Other Ambulatory Visit: Payer: Medicare HMO

## 2017-07-11 ENCOUNTER — Telehealth: Payer: Self-pay | Admitting: *Deleted

## 2017-07-11 NOTE — Telephone Encounter (Signed)
Received return call from [facility] at [facility]. The death certificate has already been certified, they will request to amend the record if Dr. Benay Spice requests. It will cost around $175 to correct.

## 2017-07-11 NOTE — Telephone Encounter (Signed)
Call from pt's nephew reporting his death certificate says colon cancer but he had pancreas cancer. Left a message with the [employer] [phone]) requesting a call back.  Can they facilitate getting the death certificate amended? Pt's nephew is requesting a return call with an update when this is corrected.

## 2017-07-12 ENCOUNTER — Telehealth: Payer: Self-pay

## 2017-07-12 NOTE — Telephone Encounter (Signed)
VM left with patient's nephew to let him know that Dr Benay Spice is working on correcting his uncle's death certificate.

## 2017-07-18 DEATH — deceased

## 2017-08-01 ENCOUNTER — Telehealth: Payer: Self-pay | Admitting: Hematology & Oncology

## 2017-08-01 NOTE — Telephone Encounter (Signed)
Faxed medical records to: [facility] F: [phone] CHART ID: [id number] [place]: 09/18[birth year]2015-Present

## 2017-08-14 ENCOUNTER — Encounter: Payer: Self-pay | Admitting: Medical Oncology

## 2017-08-21 ENCOUNTER — Other Ambulatory Visit: Payer: Self-pay | Admitting: Nurse Practitioner

## 2018-04-12 IMAGING — CT CT ABD-PELV W/ CM
2 of 5 series · 15 of 46 positions shown, 17 images · IV contrast (ISOVUE)
Comparison: 02/06/2017

CLINICAL DATA: Pancreatic cancer, jaundice

EXAM:
CT ABDOMEN AND PELVIS WITH CONTRAST
TECHNIQUE: Multidetector CT imaging of the abdomen and pelvis was performed
using the standard protocol following bolus administration of
intravenous contrast.
CONTRAST:  100mL [id number] IOPAMIDOL ([id number]) INJECTION 61%

[Series 2: abd/pel with · axial · 0.69mm/px · z∈[-422,-42]mm · 12 of 90 slices shown, 14 images]
[im 7/90  soft-tissue]
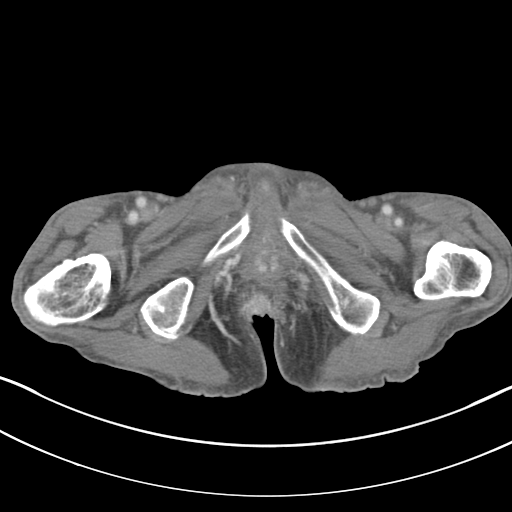
[im 7/90  bone]
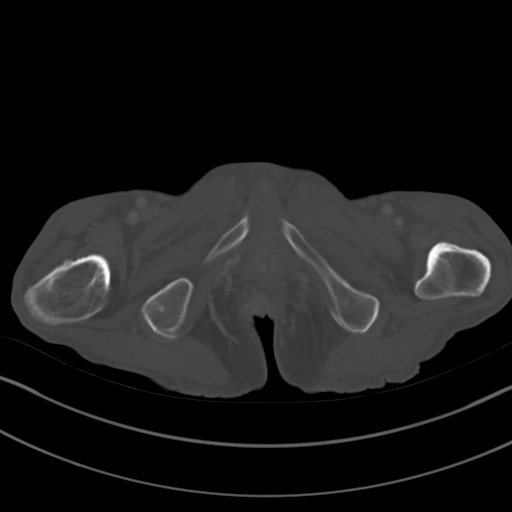
[im 14/90  soft-tissue]
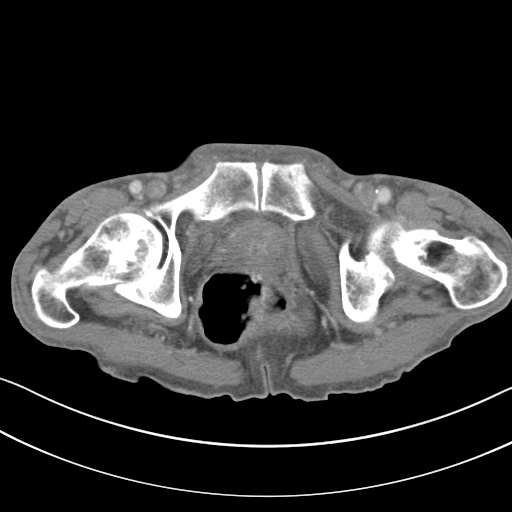
[im 21/90  soft-tissue]
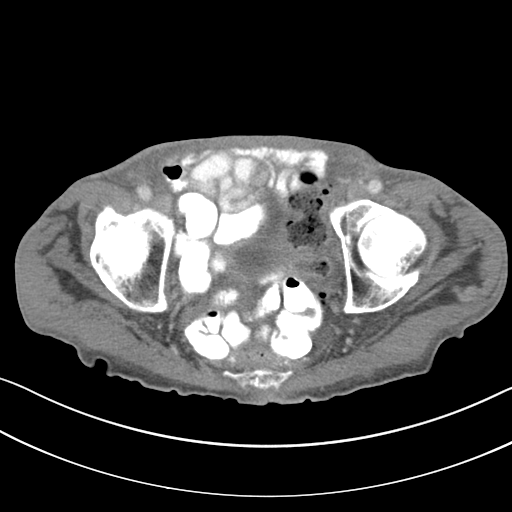
[im 28/90  soft-tissue]
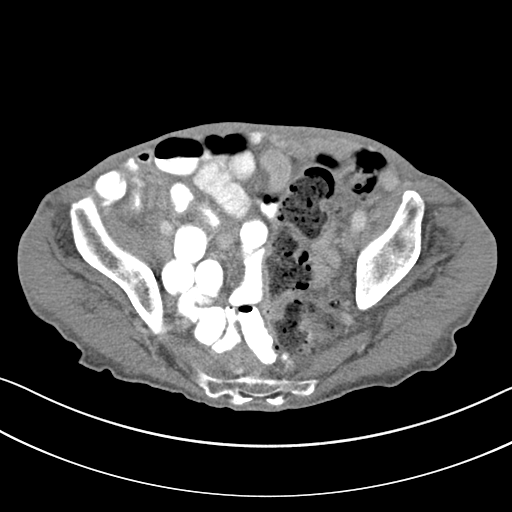
[im 35/90  soft-tissue]
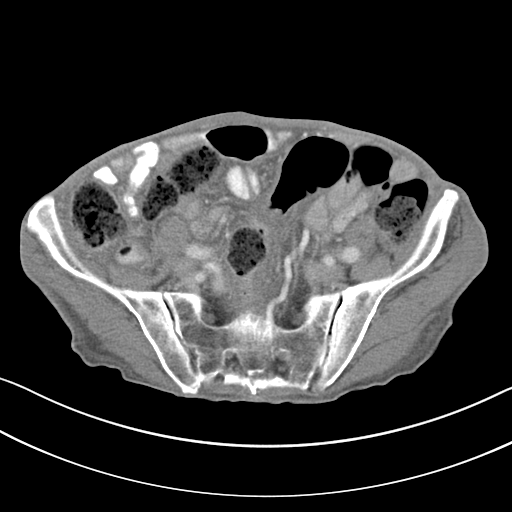
[im 42/90  soft-tissue]
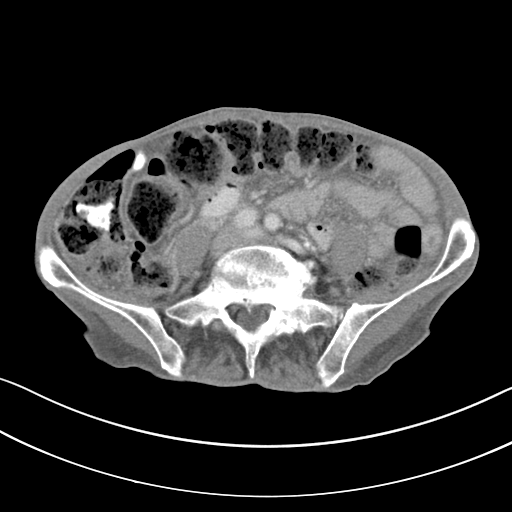
[im 48/90  soft-tissue]
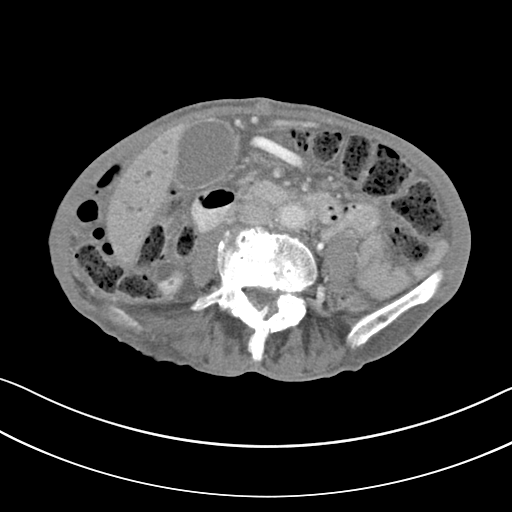
[im 55/90  soft-tissue]
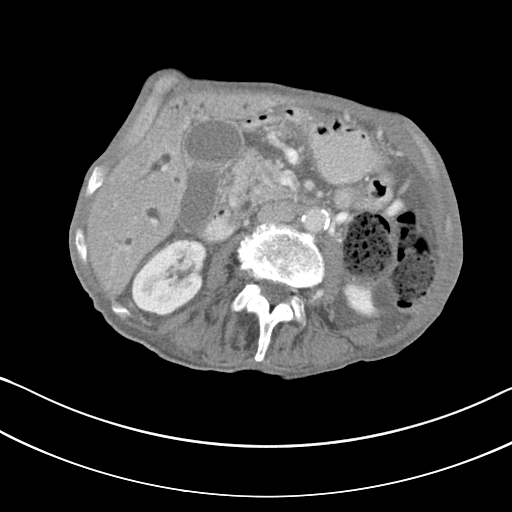
[im 62/90  soft-tissue]
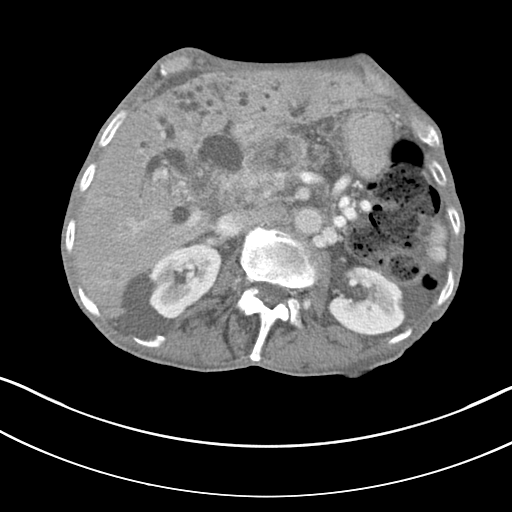
[im 62/90  bone]
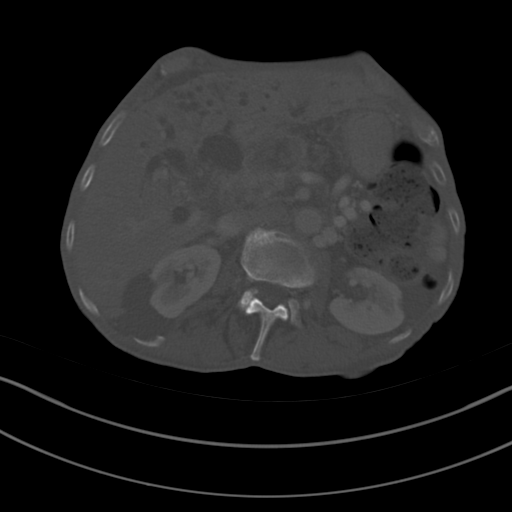
[im 69/90  soft-tissue]
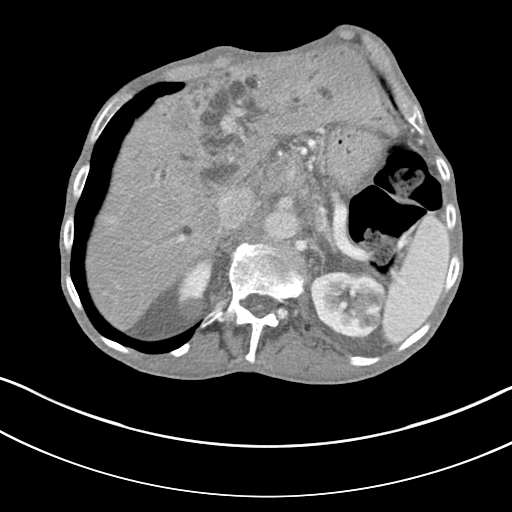
[im 76/90  soft-tissue]
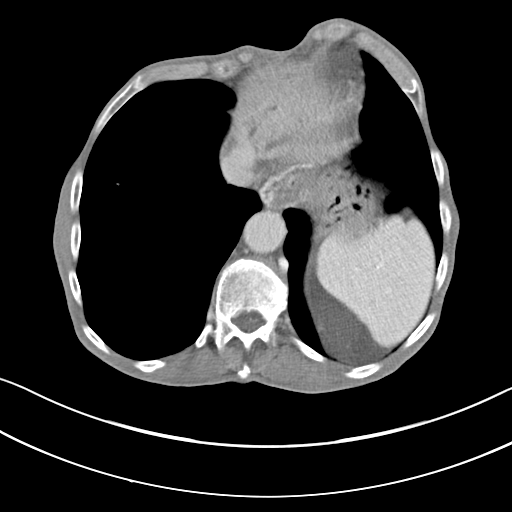
[im 83/90  soft-tissue]
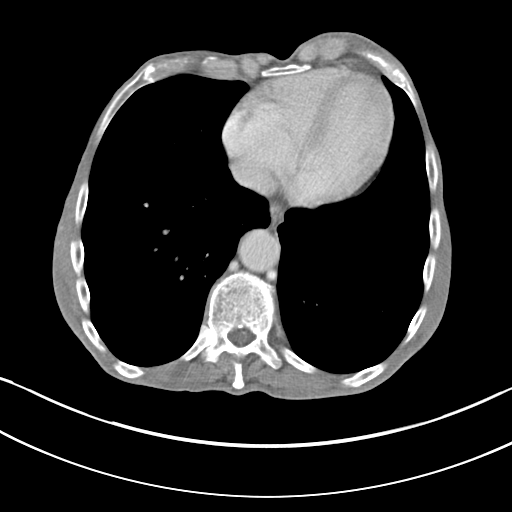

[Series 5: coronal a/|p · coronal · 0.73mm/px · 3 of 129 slices shown]
[im 43/129  soft-tissue]
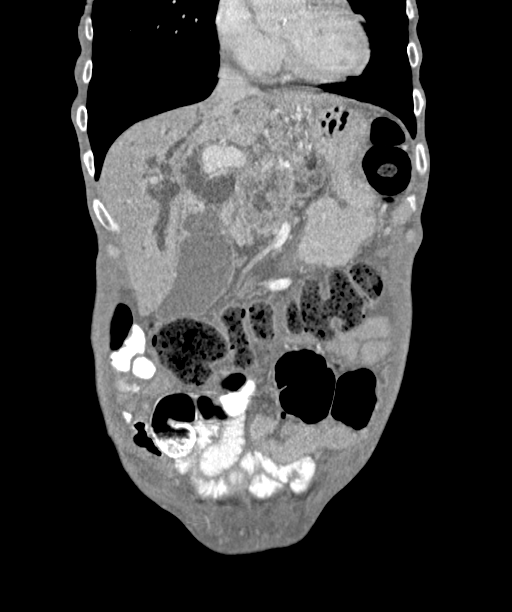
[im 57/129  soft-tissue]
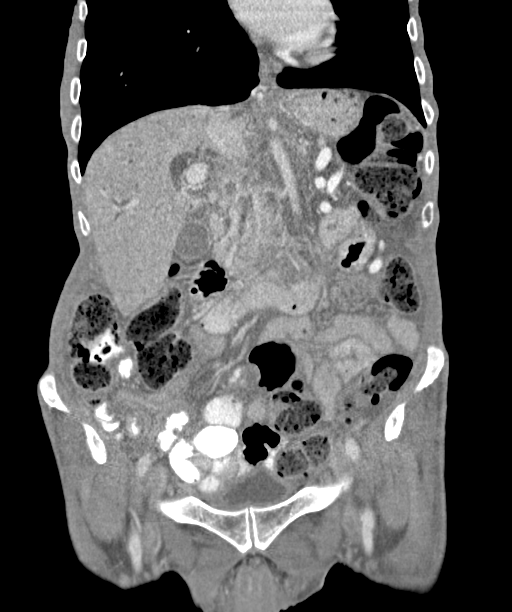
[im 72/129  soft-tissue]
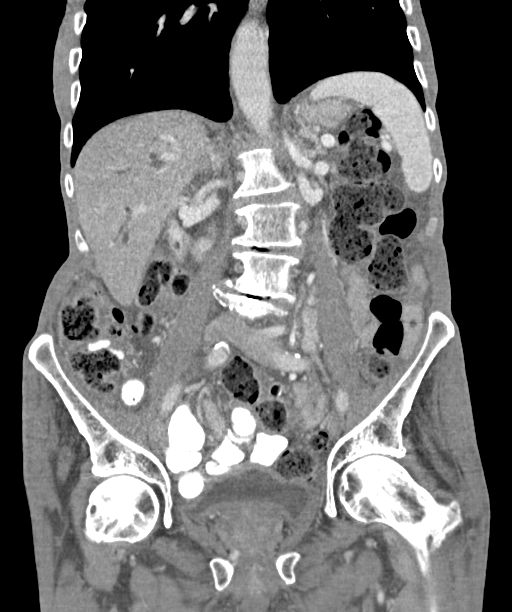

[15 of 46 positions shown; findings below may reference images not displayed]

FINDINGS: Lower chest: Hyperinflation of the lung bases, suspect emphysema. No
pericardial or pleural effusion. Normal heart size.

Hepatobiliary: Interval development of significant intra and
extrahepatic diffuse biliary dilatation/obstruction. This appears
secondary to the known pancreatic proximal body mass which is
significantly larger now measuring 4.8 x 4.6 cm, previously 3.1 x
3.8 cm. There is diffuse distal pancreatic atrophy and duct
dilatation as before. No definite focal hepatic lesion or mass.
Hepatic and portal veins remain patent. There is mass effect on the
portal splenic confluence with discontinuity. Difficult to exclude
thrombosis in this region. Mid upper abdominal ascites and
mesenteric edema. Gallbladder is also moderately distended.

Pancreas: Enlarging pancreatic body mass, measurements above.
Diffuse pancreatic atrophy and distal duct dilatation.

Spleen: Normal in size without focal abnormality.

Adrenals/Urinary Tract: No gross adrenal abnormality. Kidneys
demonstrate normal enhancement and excretion. Several bilateral
renal cystic lesions, grossly stable. No hydronephrosis or
obstruction. Urinary bladder is underdistended.

Stomach/Bowel: Negative for bowel obstruction, significant
dilatation, or ileus. Moderate retained stool throughout the colon
compatible with constipation. Colonic diverticulosis noted. No free
air, fluid collection, or abscess.

Vascular/Lymphatic: Atherosclerosis of the aorta and iliac vessels.
Negative for aneurysm. Mesenteric and renal vasculature appear
patent.

Difficult to exclude metastatic adenopathy along the gastro hepatic
ligament. This is difficult to accurately measure.

Reproductive: No significant or acute finding by CT

Other: No inguinal or abdominal wall hernia.

Musculoskeletal: Bones are osteopenic. Degenerative changes of the
spine with an associated scoliosis. No acute osseous finding or
suspicious osseous lesion
IMPRESSION: Enlarging pancreatic proximal body mass with interval development of
biliary obstruction with intra and extrahepatic marked biliary
dilatation since 02/06/2017. Associated upper abdominal mild ascites
and mesenteric edema.

Other chronic findings are grossly stable.
